# Patient Record
Sex: Male | Born: 1980 | Hispanic: Yes | Marital: Married | Smoking: Never smoker
Health system: Southern US, Community
[De-identification: ages and names within clinical notes are randomized; demographics above are authoritative.]

## PROBLEM LIST (undated history)

## (undated) DIAGNOSIS — I10 Essential (primary) hypertension: Secondary | ICD-10-CM

## (undated) HISTORY — PX: CHOLECYSTECTOMY: SHX55

## (undated) HISTORY — DX: Essential (primary) hypertension: I10

## (undated) HISTORY — PX: OTHER SURGICAL HISTORY: SHX169

---
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
## 2004-01-05 ENCOUNTER — Ambulatory Visit (HOSPITAL_COMMUNITY): Admission: RE | Admit: 2004-01-05 | Discharge: 2004-01-05 | Payer: Self-pay | Admitting: Orthopedic Surgery

## 2004-07-23 ENCOUNTER — Ambulatory Visit (HOSPITAL_BASED_OUTPATIENT_CLINIC_OR_DEPARTMENT_OTHER): Admission: RE | Admit: 2004-07-23 | Discharge: 2004-07-23 | Payer: Self-pay | Admitting: Orthopaedic Surgery

## 2004-08-07 ENCOUNTER — Ambulatory Visit (HOSPITAL_COMMUNITY): Admission: RE | Admit: 2004-08-07 | Discharge: 2004-08-07 | Payer: Self-pay | Admitting: Emergency Medicine

## 2004-08-07 ENCOUNTER — Emergency Department (HOSPITAL_COMMUNITY): Admission: EM | Admit: 2004-08-07 | Discharge: 2004-08-07 | Payer: Self-pay | Admitting: Emergency Medicine

## 2004-09-16 ENCOUNTER — Ambulatory Visit (HOSPITAL_COMMUNITY): Admission: RE | Admit: 2004-09-16 | Discharge: 2004-09-17 | Payer: Self-pay | Admitting: General Surgery

## 2005-04-19 IMAGING — US US ABDOMEN COMPLETE
1 series · 14 of 25 positions shown · non-contrast
Comparison: none

CLINICAL DATA: 23-year-old with right upper quadrant abdominal pain. 
 ULTRASOUND OF THE ABDOMEN COMPLETE:
 There is a contracted thick walled gallbladder with acoustic shadowing (REINELIO [REDACTED]).  Negative sonographic Murphy?s sign. The common bile duct is normal in caliber measuring 3.6 mm.  The liver demonstrates diffuse increased echogenicity with poor definition of the architecture and increased through transmission suggesting fatty infiltration. The IVC and aorta are normal.  The pancreas is not well visualized due to overlying bowel gas. The spleen is normal in size measuring 8.5 cm.  The right kidney measures 11.5 cm and the left kidney measures 12.1 cm.  No hydronephrosis, renal masses, or perinephric fluid collections.

[Series 1: unknown · 0.37mm/px · 14 of 84 slices shown]
[im 1/84]
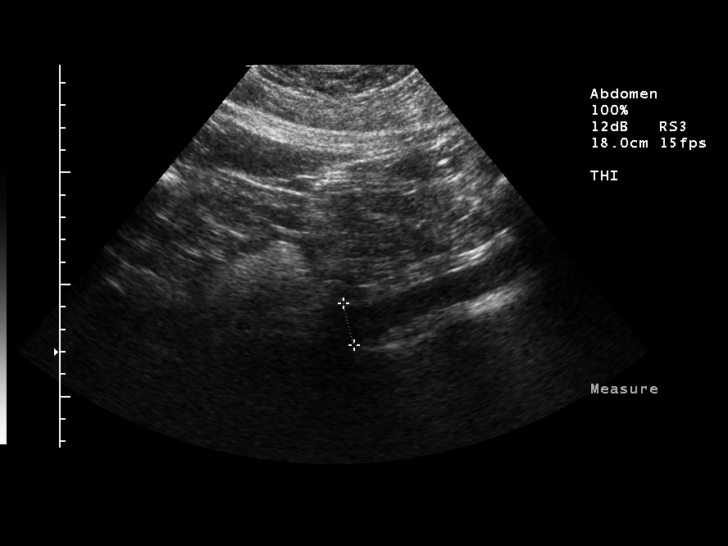
[im 7/84]
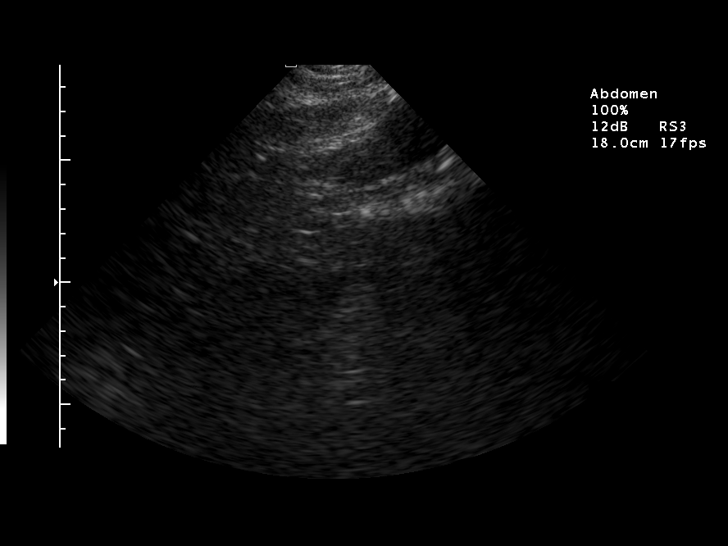
[im 14/84]
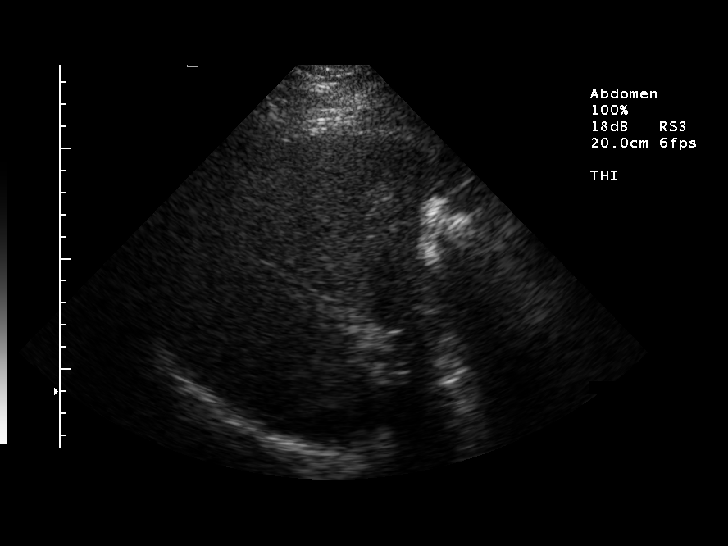
[im 21/84]
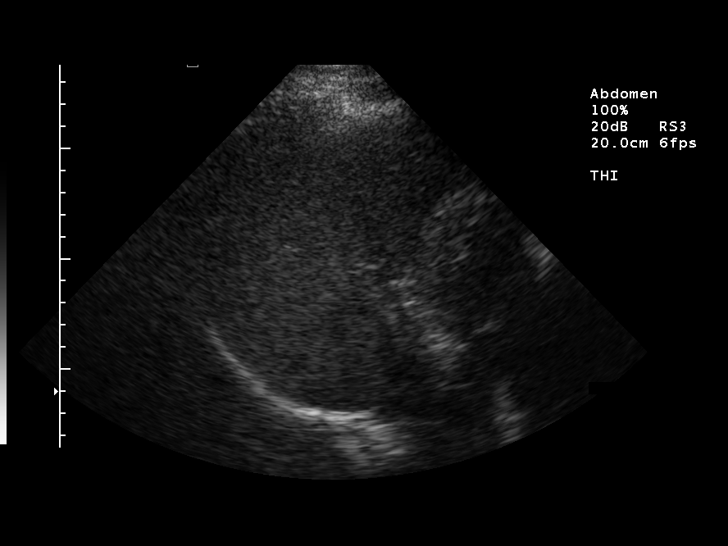
[im 28/84]
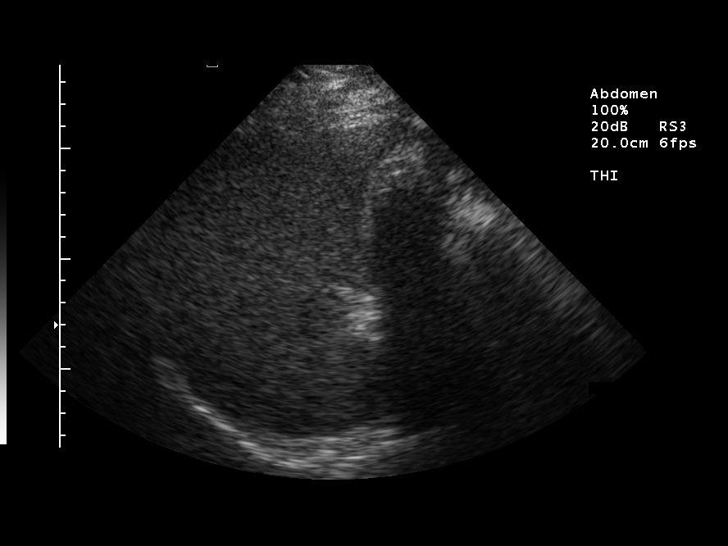
[im 32/84]
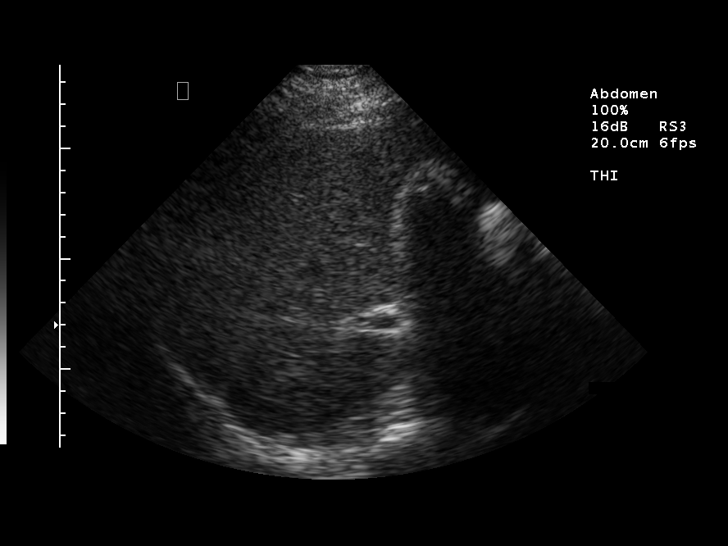
[im 39/84]
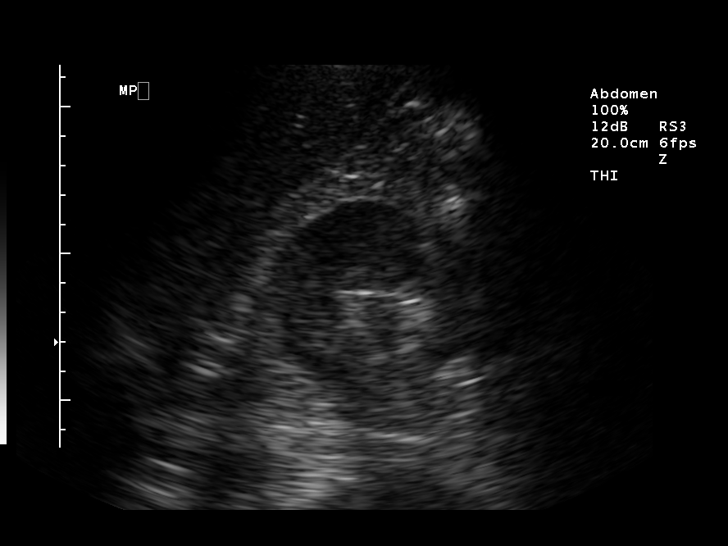
[im 45/84]
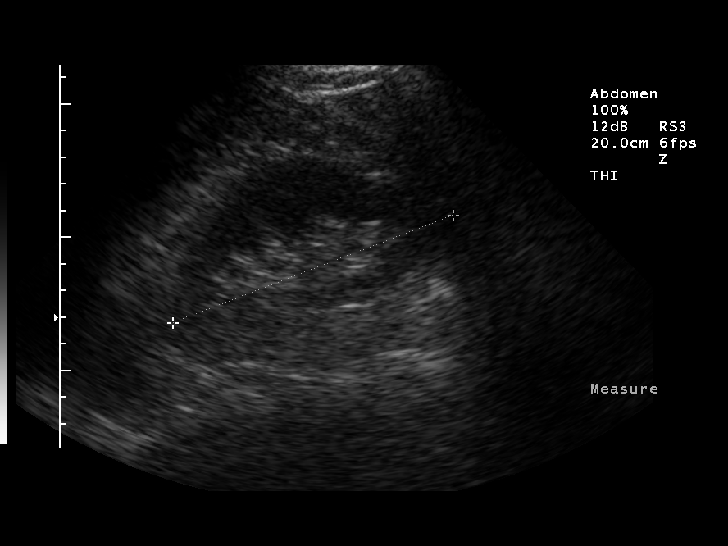
[im 52/84]
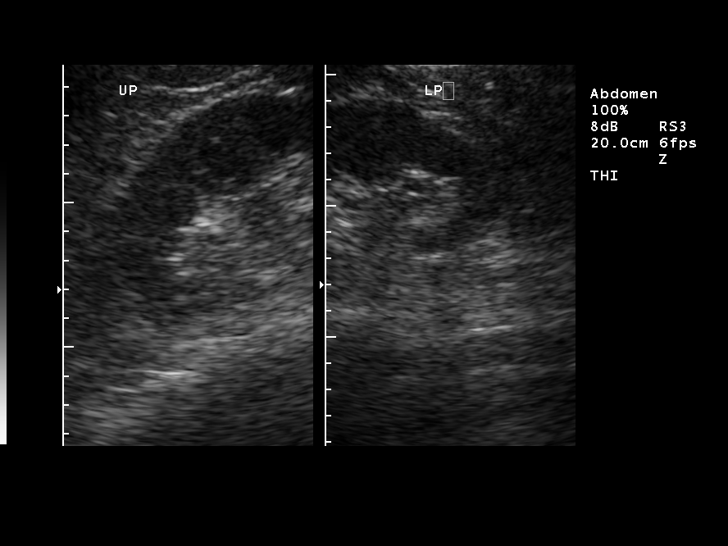
[im 56/84]
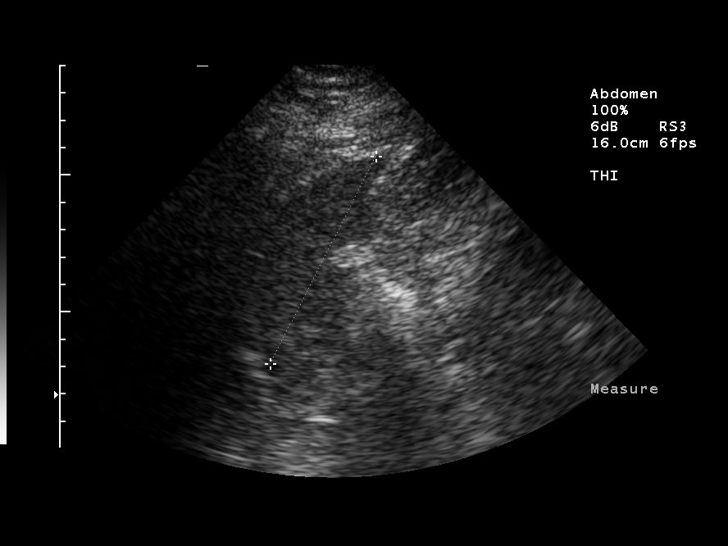
[im 63/84]
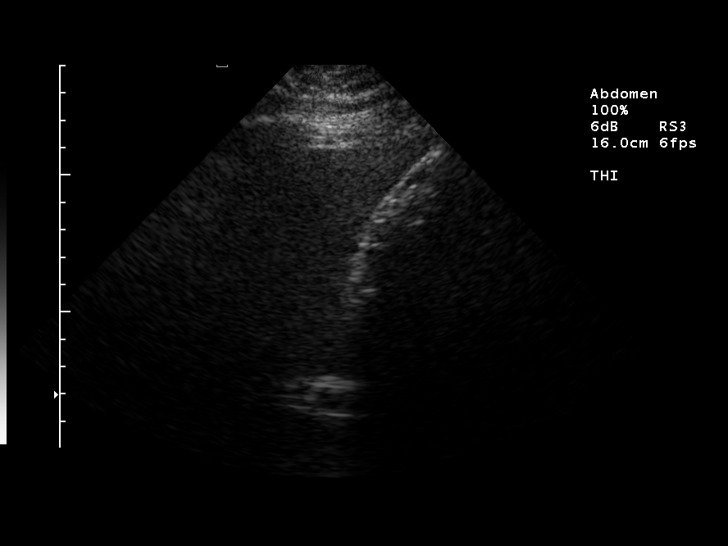
[im 70/84]
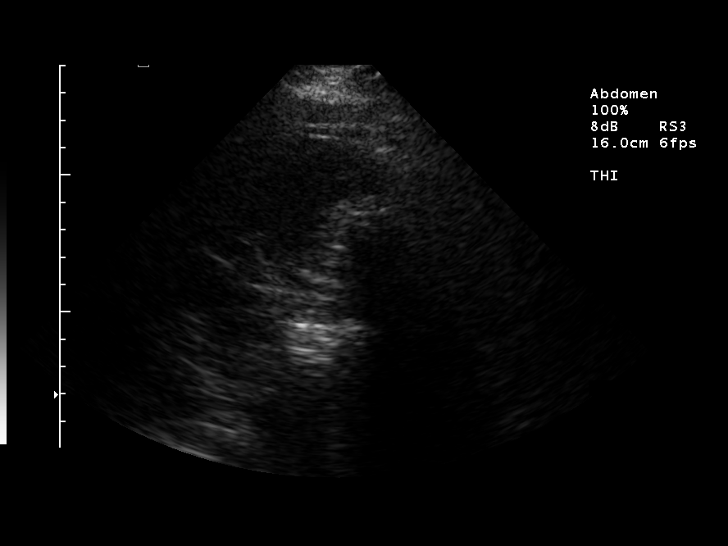
[im 77/84]
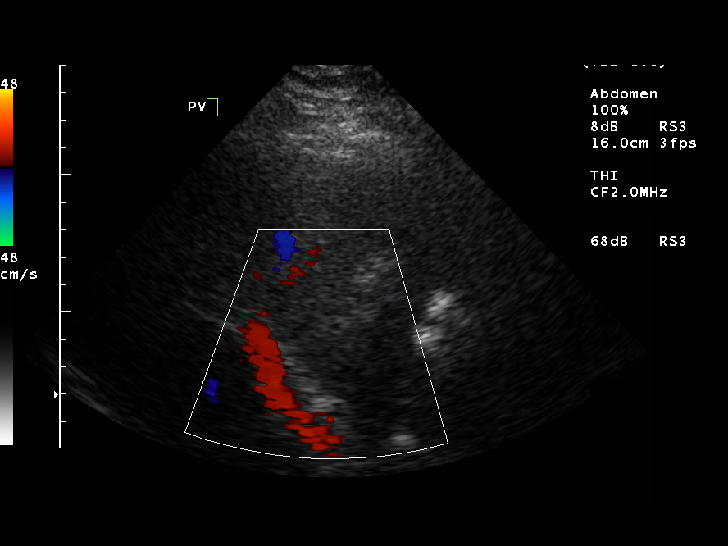
[im 84/84]
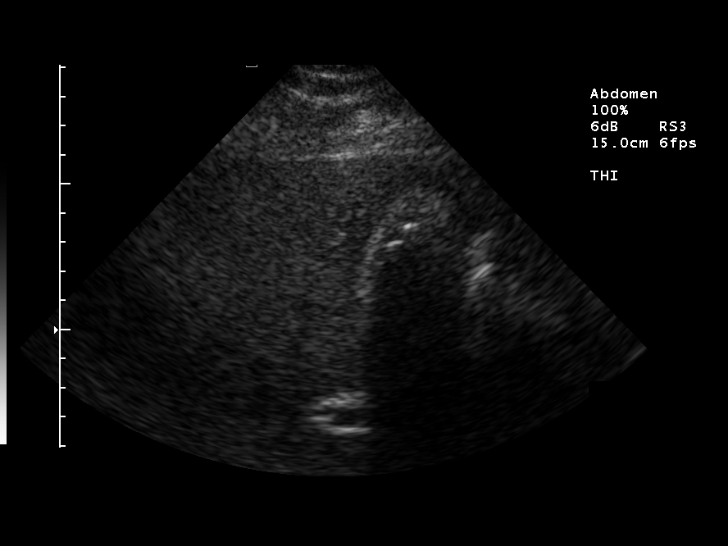

[14 of 25 positions shown; findings below may reference images not displayed]

IMPRESSION: 1.  Contracted stone-filled gallbladder with wall thickening (REINELIO [REDACTED]).  
 2.  Normal caliber common bile duct. 
 3.  Mild diffuse fatty infiltration of the liver.
 4.  Poor visualization of the pancreas.

## 2005-05-29 IMAGING — RF DG CHOLANGIOGRAM OPERATIVE
1 series · 4 of 4 positions shown · non-contrast
Comparison: none

CLINICAL DATA: Cholelithiasis.  Laparoscopic cholecystectomy.
 INTRAOPERATIVE CHOLANGIOGRAM ? 09/16/04: 
 Contrast was injected through the cystic duct remnant with filling of the common hepatic and the common bile ducts.  The cystic duct appears to have a low insertion.  Contrast is noted to pass readily from the common bile duct into the duodenum without obstruction or suggestion of stones.

[Series 1: run · 4 of 47 frames shown]
[frame 8/47]
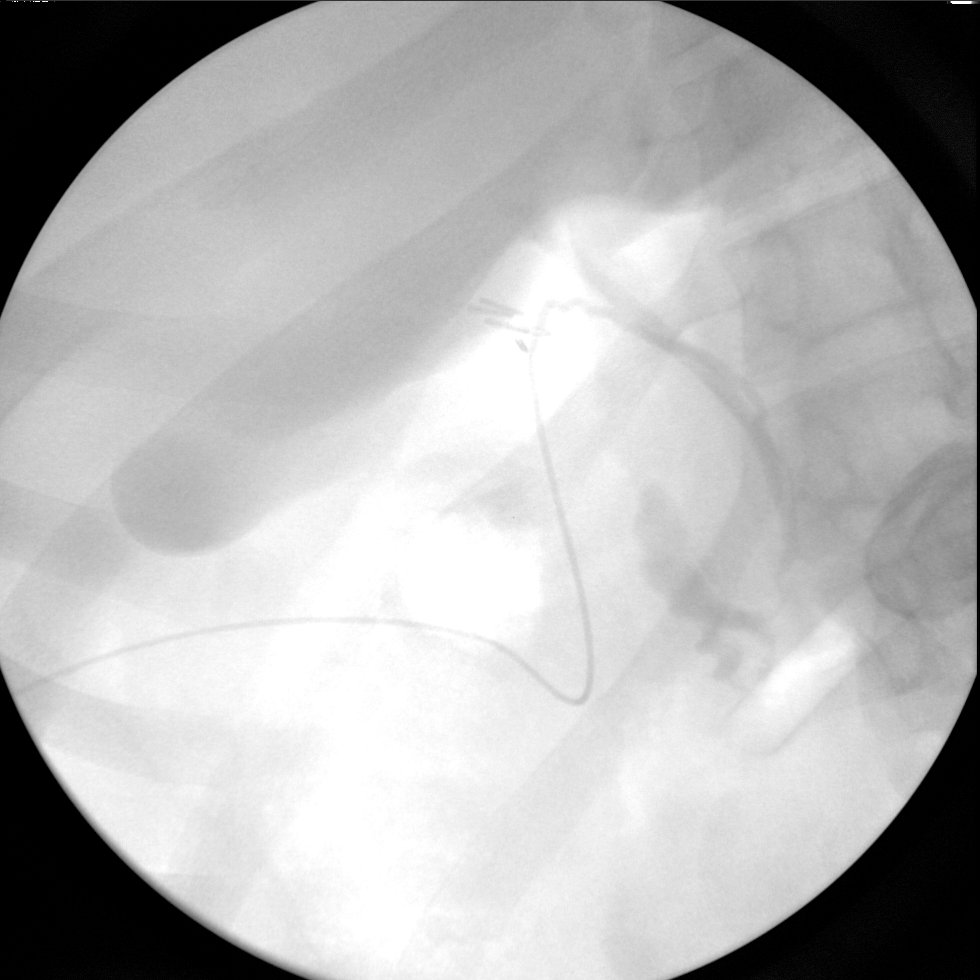
[frame 24/47]
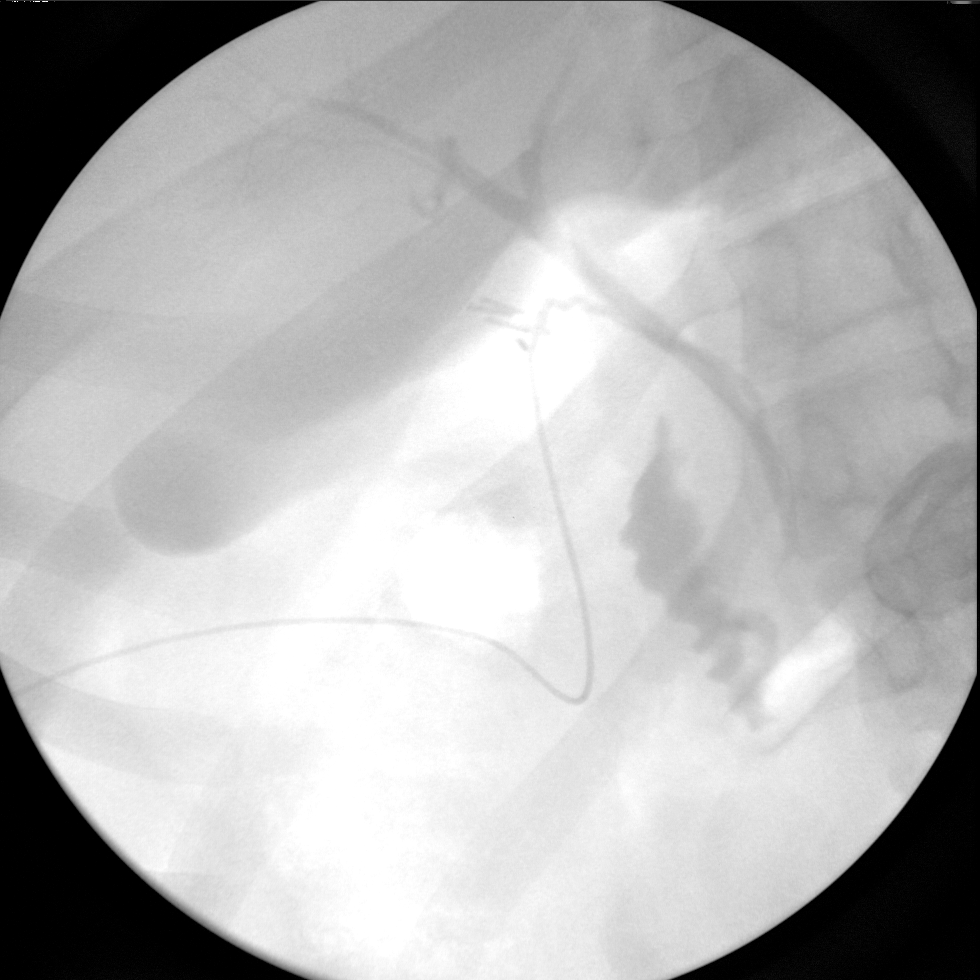
[frame 40/47]
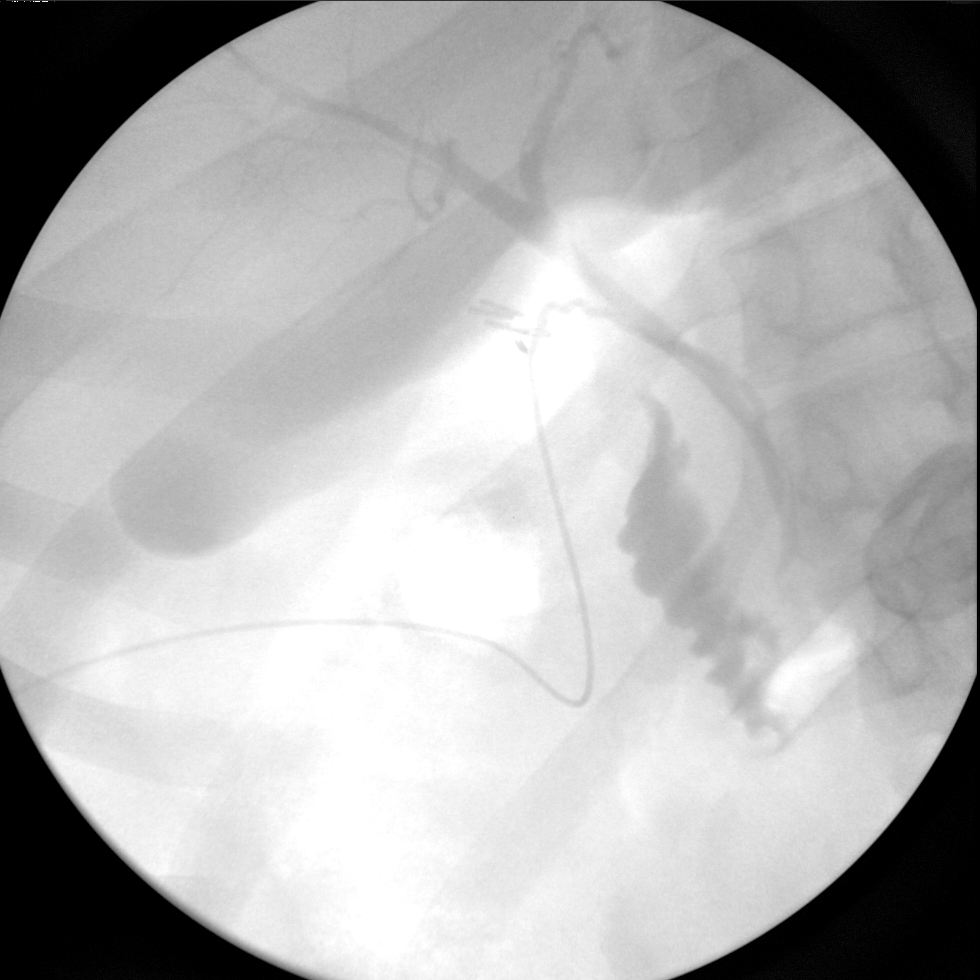
[frame 43/47]
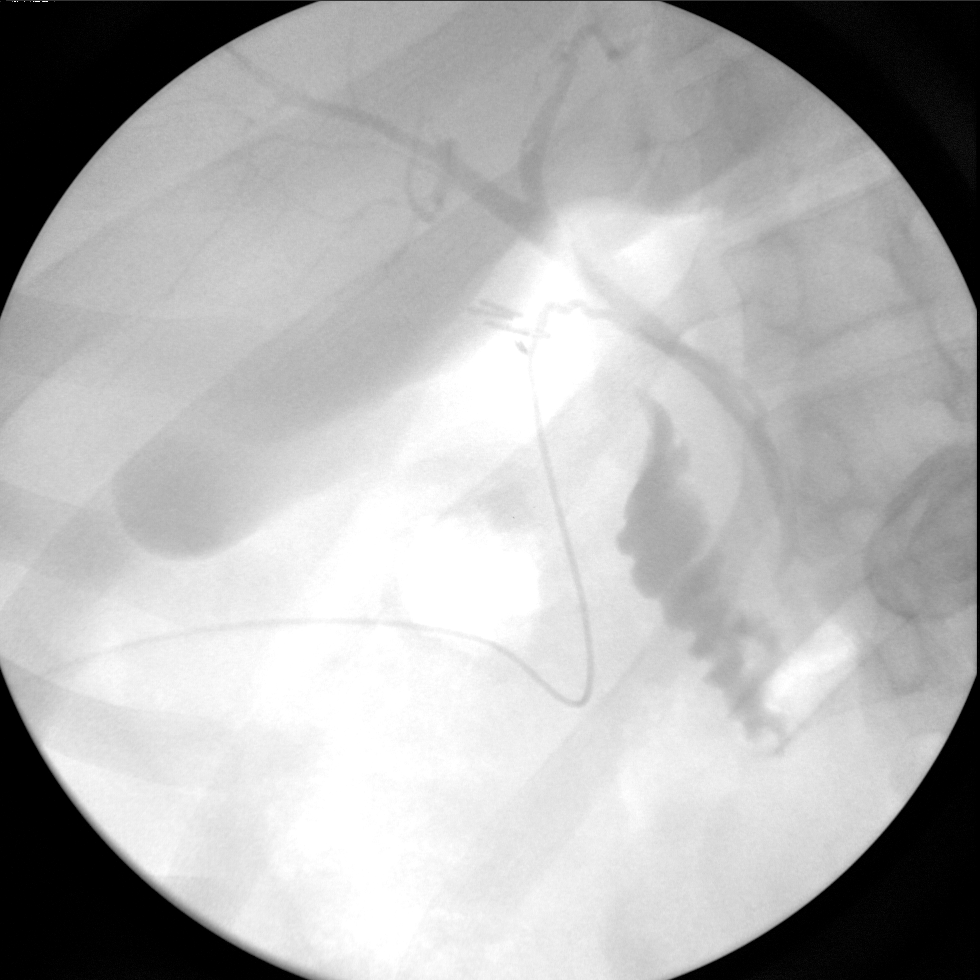

[4 of 4 positions shown; findings below may reference images not displayed]

IMPRESSION: Patency of the common bile duct without suggestion of stones with other discussion as above.

## 2021-05-20 ENCOUNTER — Other Ambulatory Visit: Payer: Self-pay

## 2021-05-21 ENCOUNTER — Encounter: Payer: Self-pay | Admitting: Medical

## 2021-05-21 ENCOUNTER — Ambulatory Visit (INDEPENDENT_AMBULATORY_CARE_PROVIDER_SITE_OTHER): Payer: PRIVATE HEALTH INSURANCE | Admitting: Medical

## 2021-05-21 VITALS — BP 140/85 | HR 66 | Temp 98.3°F | Resp 18 | Ht 71.0 in | Wt 299.2 lb

## 2021-05-21 DIAGNOSIS — Z8042 Family history of malignant neoplasm of prostate: Secondary | ICD-10-CM | POA: Diagnosis not present

## 2021-05-21 DIAGNOSIS — Z23 Encounter for immunization: Secondary | ICD-10-CM | POA: Diagnosis not present

## 2021-05-21 DIAGNOSIS — Z0000 Encounter for general adult medical examination without abnormal findings: Secondary | ICD-10-CM

## 2021-05-21 DIAGNOSIS — Z113 Encounter for screening for infections with a predominantly sexual mode of transmission: Secondary | ICD-10-CM | POA: Diagnosis not present

## 2021-05-21 DIAGNOSIS — R030 Elevated blood-pressure reading, without diagnosis of hypertension: Secondary | ICD-10-CM | POA: Diagnosis not present

## 2021-05-21 DIAGNOSIS — Z125 Encounter for screening for malignant neoplasm of prostate: Secondary | ICD-10-CM | POA: Diagnosis not present

## 2021-05-21 LAB — CBC WITH DIFFERENTIAL/PLATELET
Basophils Absolute: 0 10*3/uL (ref 0.0–0.1)
Basophils Relative: 0.6 % (ref 0.0–3.0)
Eosinophils Absolute: 0 10*3/uL (ref 0.0–0.7)
Eosinophils Relative: 0.6 % (ref 0.0–5.0)
HCT: 45.3 % (ref 39.0–52.0)
Hemoglobin: 15 g/dL (ref 13.0–17.0)
Lymphocytes Relative: 34.5 % (ref 12.0–46.0)
Lymphs Abs: 2.1 10*3/uL (ref 0.7–4.0)
MCHC: 33.1 g/dL (ref 30.0–36.0)
MCV: 82.2 fl (ref 78.0–100.0)
Monocytes Absolute: 0.5 10*3/uL (ref 0.1–1.0)
Monocytes Relative: 8.9 % (ref 3.0–12.0)
Neutro Abs: 3.3 10*3/uL (ref 1.4–7.7)
Neutrophils Relative %: 55.4 % (ref 43.0–77.0)
Platelets: 250 10*3/uL (ref 150.0–400.0)
RBC: 5.51 Mil/uL (ref 4.22–5.81)
RDW: 13.1 % (ref 11.5–15.5)
WBC: 6 10*3/uL (ref 4.0–10.5)

## 2021-05-21 LAB — COMPREHENSIVE METABOLIC PANEL
ALT: 10 U/L (ref 0–53)
AST: 18 U/L (ref 0–37)
Albumin: 4.4 g/dL (ref 3.5–5.2)
Alkaline Phosphatase: 67 U/L (ref 39–117)
BUN: 12 mg/dL (ref 6–23)
CO2: 30 mEq/L (ref 19–32)
Calcium: 9.5 mg/dL (ref 8.4–10.5)
Chloride: 102 mEq/L (ref 96–112)
Creatinine, Ser: 0.81 mg/dL (ref 0.40–1.50)
GFR: 110.4 mL/min (ref >60.00)
Glucose, Bld: 83 mg/dL (ref 70–99)
Potassium: 4.4 mEq/L (ref 3.5–5.1)
Sodium: 138 mEq/L (ref 135–145)
Total Bilirubin: 0.5 mg/dL (ref 0.2–1.2)
Total Protein: 7.2 g/dL (ref 6.0–8.3)

## 2021-05-21 LAB — LIPID PANEL
Cholesterol: 190 mg/dL (ref 0–200)
HDL: 49.9 mg/dL (ref >39.00)
LDL Cholesterol: 113 mg/dL — ABNORMAL HIGH (ref 0–99)
NonHDL: 140.25
Total CHOL/HDL Ratio: 4
Triglycerides: 136 mg/dL (ref 0.0–149.0)
VLDL: 27.2 mg/dL (ref 0.0–40.0)

## 2021-05-21 LAB — PSA: PSA: 0.68 ng/mL (ref 0.10–4.00)

## 2021-05-21 NOTE — Patient Instructions (Addendum)
For you wellness exam today I have ordered cbc, cmp,lipid panel and hiv.  Screening psa due to FH. Will consider referral to urologist based on family history.(After psa came back very low decided to monitor yearly basis or sooner if symptomatic)  Vaccine given today. Tdap only today. Declines flu and covid vaccine.  Recommend exercise and healthy diet.  We will let you know lab results as they come in.  Follow up date appointment will be determined after lab review.    For elevated blood pressure try low salt diet, mild exercise and 10-15 lb further weight loss. If bp remaining high then rx low dose bp med. Recommend get machine otc and check when relaxed at home. Would like bp to be 130/80 or less. If not let us know.  Preventive Care 80-40 Years Old, Male Preventive care refers to lifestyle choices and visits with your health care provider that can promote health and wellness. This includes: A yearly physical exam. This is also called an annual wellness visit. Regular dental and eye exams. Immunizations. Screening for certain conditions. Healthy lifestyle choices, such as: Eating a healthy diet. Getting regular exercise. Not using drugs or products that contain nicotine and tobacco. Limiting alcohol use. What can I expect for my preventive care visit? Physical exam Your health care provider will check your: Height and weight. These may be used to calculate your BMI (body mass index). BMI is a measurement that tells if you are at a healthy weight. Heart rate and blood pressure. Body temperature. Skin for abnormal spots. Counseling Your health care provider may ask you questions about your: Past medical problems. Family's medical history. Alcohol, tobacco, and drug use. Emotional well-being. Home life and relationship well-being. Sexual activity. Diet, exercise, and sleep habits. Work and work Astronomer. Access to firearms. What immunizations do I need? Vaccines are  usually given at various ages, according to a schedule. Your health care provider will recommend vaccines for you based on your age, medical history, and lifestyle or other factors, such as travel or where you work. What tests do I need? Blood tests Lipid and cholesterol levels. These may be checked every 5 years, or more often if you are over 10 years old. Hepatitis C test. Hepatitis B test. Screening Lung cancer screening. You may have this screening every year starting at age 40 if you have a 30-pack-year history of smoking and currently smoke or have quit within the past 15 years. Prostate cancer screening. Recommendations will vary depending on your family history and other risks. Genital exam to check for testicular cancer or hernias. Colorectal cancer screening. All adults should have this screening starting at age 40 and continuing until age 40. Your health care provider may recommend screening at age 40 if you are at increased risk. You will have tests every 1-10 years, depending on your results and the type of screening test. Diabetes screening. This is done by checking your blood sugar (glucose) after you have not eaten for a while (fasting). You may have this done every 1-3 years. STD (sexually transmitted disease) testing, if you are at risk. Follow these instructions at home: Eating and drinking  Eat a diet that includes fresh fruits and vegetables, whole grains, lean protein, and low-fat dairy products. Take vitamin and mineral supplements as recommended by your health care provider. Do not drink alcohol if your health care provider tells you not to drink. If you drink alcohol: Limit how much you have to 0-2 drinks a day. Be  aware of how much alcohol is in your drink. In the [place] one drink equals one 12 oz bottle of beer (355 mL), one 5 oz glass of wine (148 mL), or one 1 oz glass of hard liquor (44 mL). Lifestyle Take daily care of your teeth and gums. Brush your teeth  every morning and night with fluoride toothpaste. Floss one time each day. Stay active. Exercise for at least 30 minutes 5 or more days each week. Do not use any products that contain nicotine or tobacco, such as cigarettes, e-cigarettes, and chewing tobacco. If you need help quitting, ask your health care provider. Do not use drugs. If you are sexually active, practice safe sex. Use a condom or other form of protection to prevent STIs (sexually transmitted infections). If told by your health care provider, take low-dose aspirin daily starting at age 40. Find healthy ways to cope with stress, such as: Meditation, yoga, or listening to music. Journaling. Talking to a trusted person. Spending time with friends and family. Safety Always wear your seat belt while driving or riding in a vehicle. Do not drive: If you have been drinking alcohol. Do not ride with someone who has been drinking. When you are tired or distracted. While texting. Wear a helmet and other protective equipment during sports activities. If you have firearms in your house, make sure you follow all gun safety procedures. What's next? Go to your health care provider once a year for an annual wellness visit. Ask your health care provider how often you should have your eyes and teeth checked. Stay up to date on all vaccines. This information is not intended to replace advice given to you by your health care provider. Make sure you discuss any questions you have with your health care provider. Document Revised: 09/28/2020 Document Reviewed: 07/15/2018 Elsevier Patient Education  2022 [employer].

## 2021-05-21 NOTE — Progress Notes (Signed)
Subjective:    Patient ID: Eduardo Powell, male    DOB: 08-Jan-1981, 40 y.o.   MRN: [id number]  HPI Pt in for first time.  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 Pt not exercising. Pt trying to eat healthy. Some purposeful weight 25 lbs over past year. Non smoker. About 12 pack of alcohol a week. Drinks 2 cups of coffee a day.  Pt has no known medical problems.  He does want a physical exam. Family history of Dad and uncle had prostates. Pt thinks dad was in Mid 50's when he was dx. Uncles prostate CA in mid 50's as well. Urologist told sons should get checked.  No history of high blood pressure.   Pt had only one covid vaccine in past.    Review of Systems  Constitutional:  Negative for chills, fatigue and fever.  HENT:  Negative for dental problem.   Respiratory:  Negative for cough, chest tightness, shortness of breath and wheezing.   Cardiovascular:  Negative for chest pain and palpitations.  Gastrointestinal:  Negative for abdominal distention, abdominal pain, anal bleeding, blood in stool, diarrhea and nausea.  Musculoskeletal:  Negative for back pain.  Skin:  Negative for rash.  Neurological:  Negative for dizziness, seizures, speech difficulty, weakness and headaches.  Hematological:  Negative for adenopathy. Does not bruise/bleed easily.  Psychiatric/Behavioral:  Negative for behavioral problems and confusion.     No past medical history on file.   Social History   Socioeconomic History   Marital status: Married    Spouse name: Not on file   Number of children: Not on file   Years of education: Not on file   Highest education level: Not on file  Occupational History   Not on file  Tobacco Use   Smoking status: Not on file   Smokeless tobacco: Not on file  Substance and Sexual Activity   Alcohol use: Not on file   Drug use: Not on file   Sexual activity: Not on file  Other Topics Concern   Not on file  Social History Narrative   Not on  file   Social Determinants of Health   Financial Resource Strain: Not on file  Food Insecurity: Not on file  Transportation Needs: Not on file  Physical Activity: Not on file  Stress: Not on file  Social Connections: Not on file  Intimate Partner Violence: Not on file      Family History  Problem Relation Age of Onset   Cancer Father    Cancer Other     Not on File  No current outpatient medications on file prior to visit.   No current facility-administered medications on file prior to visit.    BP (!) 142/90   Pulse 66   Temp 98.3 F (36.8 C)   Resp 18   Ht 5\' 11"  (1.803 m)   Wt 299 lb 3.2 oz (135.7 kg)   SpO2 97%   BMI 41.73 kg/m        Objective:   Physical Exam  General Mental Status- Alert. General Appearance- Not in acute distress.   Skin General: Color- Normal Color. Moisture- Normal Moisture.  Neck Carotid Arteries- Normal color. Moisture- Normal Moisture. No carotid bruits. No JVD.  Chest and Lung Exam Auscultation: Breath Sounds:-Normal.  Cardiovascular Auscultation:Rythm- Regular. Murmurs & Other Heart Sounds:Auscultation of the heart reveals- No Murmurs.  Abdomen Inspection:-Inspeection Normal. Palpation/Percussion:Note:No mass. Palpation and Percussion of the abdomen reveal- Non Tender, Non  Distended + BS, no rebound or guarding.  Neurologic Cranial Nerve exam:- CN III-XII intact(No nystagmus), symmetric smile. Strength:- 5/5 equal and symmetric strength both upper and lower extremities.       Assessment & Plan:   Patient Instructions  For you wellness exam today I have ordered cbc, cmp,lipid panel and hiv.  Screening psa due to FH. Will consider referral to urologist based on family history.  Vaccine given today. Tdap only today. Declines flu and covid vaccine.  Recommend exercise and healthy diet.  We will let you know lab results as they come in.  Follow up date appointment will be determined after lab review.     For elevated blood pressure try low salt diet, mild exercise and 10-15 lb further weight loss. If bp remaining high then rx low dose bp med. Recommend get machine otc and check when relaxed at home. Would like bp to be 130/80 or less. If not let us know    Esperanza Richters, PA-C

## 2021-05-21 NOTE — Addendum Note (Signed)
Addended by: Maximino Sarin on: 05/21/2021 10:19 AM   Modules accepted: Orders

## 2021-05-22 LAB — HIV ANTIBODY (ROUTINE TESTING W REFLEX): HIV 1&2 Ab, 4th Generation: NONREACTIVE

## 2021-09-24 ENCOUNTER — Ambulatory Visit (INDEPENDENT_AMBULATORY_CARE_PROVIDER_SITE_OTHER): Payer: PRIVATE HEALTH INSURANCE | Admitting: Medical

## 2021-09-24 ENCOUNTER — Encounter: Payer: Self-pay | Admitting: Medical

## 2021-09-24 VITALS — BP 134/90 | HR 71 | Resp 18 | Ht 71.0 in | Wt 289.0 lb

## 2021-09-24 DIAGNOSIS — I10 Essential (primary) hypertension: Secondary | ICD-10-CM

## 2021-09-24 MED ORDER — LOSARTAN POTASSIUM 25 MG PO TABS: 25.0000 mg | ORAL_TABLET | Freq: Every day | ORAL | 11 refills | Status: DC

## 2021-09-24 NOTE — Progress Notes (Signed)
Subjective:    Patient ID: Eduardo Powell, male    DOB: [birth year], 41 y.o.   MRN: [id number]  HPI  Pt had some bp levels in moderate hight range. 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 Also at home readings bp was 140/90.   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  No hx of htn/being on meds.  Pt has been trying to reduce salt.    Has been beginning to hike on Saturdays.  05-05-21 normal cmp.   Review of Systems  Constitutional:  Negative for chills, fatigue and fever.  HENT:  Negative for congestion and drooling.   Respiratory:  Negative for cough, chest tightness, shortness of breath and wheezing.   Cardiovascular:  Negative for chest pain and palpitations.  Gastrointestinal:  Negative for abdominal pain.  Genitourinary:  Negative for dysuria and frequency.  Musculoskeletal:  Negative for back pain, joint swelling, myalgias and neck pain.  Neurological:  Negative for dizziness, numbness and headaches.  Hematological:  Negative for adenopathy. Does not bruise/bleed easily.  Psychiatric/Behavioral:  Negative for behavioral problems and dysphoric mood.     No past medical history on file.   Social History   Socioeconomic History   Marital status: Married    Spouse name: Not on file   Number of children: Not on file   Years of education: Not on file   Highest education level: Not on file  Occupational History   Not on file  Tobacco Use   Smoking status: Never   Smokeless tobacco: Never  Vaping Use   Vaping Use: Never used  Substance and Sexual Activity   Alcohol use: Yes    Alcohol/week: 12.0 standard drinks    Types: 12 Cans of beer per week    Comment: ususally spread out over a week.   Drug use: Never   Sexual activity: Yes  Other Topics Concern   Not on file  Social History Narrative   ** Merged History Encounter **       Social Determinants of Health   Financial Resource Strain: Not on file  Food Insecurity: Not on file  Transportation Needs: Not on file   Physical Activity: Not on file  Stress: Not on file  Social Connections: Not on file  Intimate Partner Violence: Not on file    Past Surgical History:  Procedure Laterality Date   CHOLECYSTECTOMY     rt side acl surgery      Family History  Problem Relation Age of Onset   Cancer Father    Cancer Other     Not on File  Current Outpatient Medications on File Prior to Visit  Medication Sig Dispense Refill   magnesium chloride (SLOW-MAG) 64 MG TBEC SR tablet Take by mouth.     No current facility-administered medications on file prior to visit.    BP (!) 127/93    Pulse 71    Resp 18    Ht 5\' 11"  (1.803 m)    Wt 289 lb (131.1 kg)    SpO2 97%    BMI 40.31 kg/m       Objective:   Physical Exam  General Mental Status- Alert. General Appearance- Not in acute distress.   Skin General: Color- Normal Color. Moisture- Normal Moisture.  Neck Carotid Arteries- Normal color. Moisture- Normal Moisture. No carotid bruits. No JVD.  Chest and Lung Exam Auscultation: Breath Sounds:-Normal.  Cardiovascular Auscultation:Rythm- Regular. Murmurs & Other Heart Sounds:Auscultation of the heart reveals- No Murmurs.  Abdomen Inspection:-Inspeection Normal.  Palpation/Percussion:Note:No mass. Palpation and Percussion of the abdomen reveal- Non Tender, Non Distended + BS, no rebound or guarding.   Neurologic Cranial Nerve exam:- CN III-XII intact(No nystagmus), symmetric smile. Strength:- 5/5 equal and symmetric strength both upper and lower extremities.       Assessment & Plan:   Patient Instructions  Htn- your blood pressure is mild high today but on average bp is 140/90. Recommend losartan 25 mg daily. Low salt diet, exercise and wt loss.  Recommend try [employer] app could be way to lose weight.  Follow up in one month or sooner if needed.   Mackie Pai, PA-C

## 2021-09-24 NOTE — Patient Instructions (Addendum)
Htn- your blood pressure is mild high today but on average bp is 140/90. Recommend losartan 25 mg daily. Low salt diet, exercise and wt loss.  Recommend try [employer] app could be way to lose weight.  Follow up in one month or sooner if needed.

## 2021-10-25 ENCOUNTER — Telehealth: Payer: PRIVATE HEALTH INSURANCE | Admitting: Medical

## 2021-10-29 ENCOUNTER — Encounter: Payer: Self-pay | Admitting: Medical

## 2021-10-29 ENCOUNTER — Telehealth (INDEPENDENT_AMBULATORY_CARE_PROVIDER_SITE_OTHER): Payer: PRIVATE HEALTH INSURANCE | Admitting: Medical

## 2021-10-29 VITALS — BP 110/68 | Wt 275.0 lb

## 2021-10-29 DIAGNOSIS — I10 Essential (primary) hypertension: Secondary | ICD-10-CM | POA: Diagnosis not present

## 2021-10-29 NOTE — Patient Instructions (Signed)
Hypertension much improved since last visit.  Appears low-dose losartan, low-salt diet and weight loss has brought her blood pressure down to tightly controlled range.  Continue with current treatment but do recommend that you check your blood pressure at least 3 times a week.  If you notice a blood pressure dropping less than 110 systolic/top number or if you are having lightheaded episodes going from sitting to standing let me know.  In that event would switch you to a lower dose/less potent medication or consider stopping BP medication altogether.  Currently have refills of losartan for 1 month. ? ?If all goes well I recommend follow-up in October for wellness exam and blood pressure check.  But sooner if needed. ?

## 2021-10-29 NOTE — Progress Notes (Signed)
? ?  Subjective:  ? ? Patient ID: Eduardo Powell, male    DOB: Nov 05, 1980, 41 y.o.   MRN: [id number] ? ?HPI ? ?Virtual Visit via Video Note ? ?I connected with Odette Horns on 10/29/21 at  4:00 PM EDT by a video enabled telemedicine application and verified that I am speaking with the correct person using two identifiers. ? ?Location: ?Patient: in [place]. ?Provider: office ?  ?I discussed the limitations of evaluation and management by telemedicine and the availability of in person appointments. The patient expressed understanding and agreed to proceed. ? ?History of Present Illness: ? ?Pt states he has been checking his bp levels have been good. He bp was 110/68 recently. Other readings 110-119/60-78. Pt states he has been eating a lot less salt. He identified that a lot of foods he was eating was very high in sodium. He has been grilling chicken and eating salads. Using dash seasonings.  ? ?One day he ate [place] food and bp did jump to 150 systolic. ? ?Pt did start the losartan one month ago. ? ? 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 ? ?Pt has no light sensitivity on standing.  ? ?Pt has lost weight. ? ? ?Observations/Objective: ?General-no acute distress, pleasant, oriented. ?Lungs- on inspection lungs appear unlabored. ?Neck- no tracheal deviation or jvd on inspection. ?Neuro- gross motor function appears intact.  ? ?Assessment and Plan: ? ?Patient Instructions  ?Hypertension much improved since last visit.  Appears low-dose losartan, low-salt diet and weight loss has brought her blood pressure down to tightly controlled range.  Continue with current treatment but do recommend that you check your blood pressure at least 3 times a week.  If you notice a blood pressure dropping less than 110 systolic/top number or if you are having lightheaded episodes going from sitting to standing let me know.  In that event would switch you to a lower dose/less potent medication or consider stopping BP medication altogether.   Currently have refills of losartan for 1 month. ? ?If all goes well I recommend follow-up in October for wellness exam and blood pressure check.  But sooner if needed.  ?Esperanza Richters, PA-C  ?Follow Up Instructions: ? ?  ?I discussed the assessment and treatment plan with the patient. The patient was provided an opportunity to ask questions and all were answered. The patient agreed with the plan and demonstrated an understanding of the instructions. ?  ?The patient was advised to call back or seek an in-person evaluation if the symptoms worsen or if the condition fails to improve as anticipated. ? ?Time spent with patient today was 15  minutes which consisted of chart revdiew, discussing diagnosis, work up treatment and documentation.  ? ?Esperanza Richters, PA-C  ? ?Review of Systems ? ?   ?Objective:  ? Physical Exam ? ? ? ? ?   ?Assessment & Plan:  ? ? ?

## 2022-08-05 ENCOUNTER — Encounter: Payer: Self-pay | Admitting: Medical

## 2022-08-05 ENCOUNTER — Ambulatory Visit (INDEPENDENT_AMBULATORY_CARE_PROVIDER_SITE_OTHER): Payer: PRIVATE HEALTH INSURANCE | Admitting: Medical

## 2022-08-05 VITALS — BP 120/80 | HR 69 | Ht 71.0 in | Wt 297.6 lb

## 2022-08-05 DIAGNOSIS — Z0000 Encounter for general adult medical examination without abnormal findings: Secondary | ICD-10-CM | POA: Diagnosis not present

## 2022-08-05 DIAGNOSIS — Z125 Encounter for screening for malignant neoplasm of prostate: Secondary | ICD-10-CM | POA: Diagnosis not present

## 2022-08-05 LAB — CBC WITH DIFFERENTIAL/PLATELET
Basophils Absolute: 0 10*3/uL (ref 0.0–0.1)
Basophils Relative: 0.4 % (ref 0.0–3.0)
Eosinophils Absolute: 0 10*3/uL (ref 0.0–0.7)
Eosinophils Relative: 0.7 % (ref 0.0–5.0)
HCT: 42.6 % (ref 39.0–52.0)
Hemoglobin: 14.4 g/dL (ref 13.0–17.0)
Lymphocytes Relative: 45.2 % (ref 12.0–46.0)
Lymphs Abs: 2.1 10*3/uL (ref 0.7–4.0)
MCHC: 33.9 g/dL (ref 30.0–36.0)
MCV: 81.6 fl (ref 78.0–100.0)
Monocytes Absolute: 0.5 10*3/uL (ref 0.1–1.0)
Monocytes Relative: 10.1 % (ref 3.0–12.0)
Neutro Abs: 2 10*3/uL (ref 1.4–7.7)
Neutrophils Relative %: 43.6 % (ref 43.0–77.0)
Platelets: 252 10*3/uL (ref 150.0–400.0)
RBC: 5.22 Mil/uL (ref 4.22–5.81)
RDW: 12.9 % (ref 11.5–15.5)
WBC: 4.6 10*3/uL (ref 4.0–10.5)

## 2022-08-05 LAB — LIPID PANEL
Cholesterol: 183 mg/dL (ref 0–200)
HDL: 37.3 mg/dL — ABNORMAL LOW (ref >39.00)
LDL Cholesterol: 108 mg/dL — ABNORMAL HIGH (ref 0–99)
NonHDL: 145.66
Total CHOL/HDL Ratio: 5
Triglycerides: 189 mg/dL — ABNORMAL HIGH (ref 0.0–149.0)
VLDL: 37.8 mg/dL (ref 0.0–40.0)

## 2022-08-05 LAB — COMPREHENSIVE METABOLIC PANEL
ALT: 37 U/L (ref 0–53)
AST: 42 U/L — ABNORMAL HIGH (ref 0–37)
Albumin: 4.1 g/dL (ref 3.5–5.2)
Alkaline Phosphatase: 55 U/L (ref 39–117)
BUN: 11 mg/dL (ref 6–23)
CO2: 27 mEq/L (ref 19–32)
Calcium: 9.1 mg/dL (ref 8.4–10.5)
Chloride: 103 mEq/L (ref 96–112)
Creatinine, Ser: 0.72 mg/dL (ref 0.40–1.50)
GFR: 113.43 mL/min (ref >60.00)
Glucose, Bld: 91 mg/dL (ref 70–99)
Potassium: 4.1 mEq/L (ref 3.5–5.1)
Sodium: 138 mEq/L (ref 135–145)
Total Bilirubin: 0.5 mg/dL (ref 0.2–1.2)
Total Protein: 7 g/dL (ref 6.0–8.3)

## 2022-08-05 LAB — PSA: PSA: 0.98 ng/mL (ref 0.10–4.00)

## 2022-08-05 MED ORDER — LOSARTAN POTASSIUM 25 MG PO TABS: 25.0000 mg | ORAL_TABLET | Freq: Every day | ORAL | 3 refills | Status: DC

## 2022-08-05 NOTE — Progress Notes (Signed)
Subjective:    Patient ID: Eduardo Powell, male    DOB: 1981/04/18, 42 y.o.   MRN: [id number]  HPI  Pt in for wellness exam.  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 Pt not exercising. Pt trying to eat healthy. Some purposeful weight 25 lbs over past 2 year(he has maintained wt loss). Non smoker. About 4-6 beers a week. Drinks 2 cups of coffee a day.    Pt declines flu vaccine. Recent uri/viral syndrome but now resolved.   Htn- on losartan 25 mg daily.      Review of Systems  Constitutional:  Negative for chills, fatigue and fever.  HENT:  Negative for congestion, ear discharge, facial swelling and mouth sores.   Respiratory:  Negative for cough, chest tightness, shortness of breath and wheezing.   Cardiovascular:  Negative for palpitations.  Gastrointestinal:  Negative for abdominal pain, constipation and nausea.  Genitourinary:  Negative for dysuria, frequency and hematuria.  Musculoskeletal:  Negative for back pain, myalgias and neck stiffness.  Skin:  Negative for rash.  Neurological:  Negative for dizziness, seizures, syncope, weakness and light-headedness.  Hematological:  Negative for adenopathy. Does not bruise/bleed easily.  Psychiatric/Behavioral:  Negative for behavioral problems, decreased concentration and hallucinations. The patient is not nervous/anxious.     No past medical history on file.   Social History   Socioeconomic History   Marital status: Married    Spouse name: Not on file   Number of children: Not on file   Years of education: Not on file   Highest education level: Not on file  Occupational History   Not on file  Tobacco Use   Smoking status: Never   Smokeless tobacco: Never  Vaping Use   Vaping Use: Never used  Substance and Sexual Activity   Alcohol use: Yes    Alcohol/week: 12.0 standard drinks of alcohol    Types: 12 Cans of beer per week    Comment: ususally spread out over a week.   Drug use: Never   Sexual  activity: Yes  Other Topics Concern   Not on file  Social History Narrative   ** Merged History Encounter **       Social Determinants of Health   Financial Resource Strain: Not on file  Food Insecurity: Not on file  Transportation Needs: Not on file  Physical Activity: Not on file  Stress: Not on file  Social Connections: Not on file  Intimate Partner Violence: Not on file    Past Surgical History:  Procedure Laterality Date   CHOLECYSTECTOMY     rt side acl surgery      Family History  Problem Relation Age of Onset   Cancer Father    Cancer Other     Not on File  Current Outpatient Medications on File Prior to Visit  Medication Sig Dispense Refill   losartan (COZAAR) 25 MG tablet Take 1 tablet (25 mg total) by mouth daily. 30 tablet 11   No current facility-administered medications on file prior to visit.    BP 120/80   Pulse 69   Ht 5\' 11"  (1.803 m)   Wt 297 lb 9.6 oz (135 kg)   SpO2 97%   BMI 41.51 kg/m        Objective:   Physical Exam  General Mental Status- Alert. General Appearance- Not in acute distress.   Skin General: Color- Normal Color. Moisture- Normal Moisture.  Neck Carotid Arteries- Normal color. Moisture- Normal Moisture.  No carotid bruits. No JVD.  Chest and Lung Exam Auscultation: Breath Sounds:-Normal.  Cardiovascular Auscultation:Rythm- Regular. Murmurs & Other Heart Sounds:Auscultation of the heart reveals- No Murmurs.  Abdomen Inspection:-Inspeection Normal. Palpation/Percussion:Note:No mass. Palpation and Percussion of the abdomen reveal- Non Tender, Non Distended + BS, no rebound or guarding.   Neurologic Cranial Nerve exam:- CN III-XII intact(No nystagmus), symmetric smile. Strength:- 5/5 equal and symmetric strength both upper and lower extremities.       Assessment & Plan:    Patient Instructions  For you wellness exam today I have ordered cbc, cmp and  lipid panel.   Flu vaccine declined.  Recommend  exercise and healthy diet.  We will let you know lab results as they come in.  Follow up date appointment will be determined after lab review.         Mackie Pai, PA-C

## 2022-08-05 NOTE — Addendum Note (Signed)
Addended by: Anabel Halon on: 08/05/2022 09:15 AM   Modules accepted: Orders

## 2022-08-05 NOTE — Patient Instructions (Addendum)
For you wellness exam today I have ordered cbc, cmp and lipid panel.   Flu vaccine declined.  Recommend exercise and healthy diet.  We will let you know lab results as they come in.  Follow up date appointment will be determined after lab review.     Preventive Care 46-42 Years Old, Male Preventive care refers to lifestyle choices and visits with your health care provider that can promote health and wellness. Preventive care visits are also called wellness exams. What can I expect for my preventive care visit? Counseling During your preventive care visit, your health care provider may ask about your: Medical history, including: Past medical problems. Family medical history. Current health, including: Emotional well-being. Home life and relationship well-being. Sexual activity. Lifestyle, including: Alcohol, nicotine or tobacco, and drug use. Access to firearms. Diet, exercise, and sleep habits. Safety issues such as seatbelt and bike helmet use. Sunscreen use. Work and work Statistician. Physical exam Your health care provider will check your: Height and weight. These may be used to calculate your BMI (body mass index). BMI is a measurement that tells if you are at a healthy weight. Waist circumference. This measures the distance around your waistline. This measurement also tells if you are at a healthy weight and may help predict your risk of certain diseases, such as type 2 diabetes and high blood pressure. Heart rate and blood pressure. Body temperature. Skin for abnormal spots. What immunizations do I need?  Vaccines are usually given at various ages, according to a schedule. Your health care provider will recommend vaccines for you based on your age, medical history, and lifestyle or other factors, such as travel or where you work. What tests do I need? Screening Your health care provider may recommend screening tests for certain conditions. This may include: Lipid and  cholesterol levels. Diabetes screening. This is done by checking your blood sugar (glucose) after you have not eaten for a while (fasting). Hepatitis B test. Hepatitis C test. HIV (human immunodeficiency virus) test. STI (sexually transmitted infection) testing, if you are at risk. Lung cancer screening. Prostate cancer screening. Colorectal cancer screening. Talk with your health care provider about your test results, treatment options, and if necessary, the need for more tests. Follow these instructions at home: Eating and drinking  Eat a diet that includes fresh fruits and vegetables, whole grains, lean protein, and low-fat dairy products. Take vitamin and mineral supplements as recommended by your health care provider. Do not drink alcohol if your health care provider tells you not to drink. If you drink alcohol: Limit how much you have to 0-2 drinks a day. Know how much alcohol is in your drink. In the [place] one drink equals one 12 oz bottle of beer (355 mL), one 5 oz glass of wine (148 mL), or one 1 oz glass of hard liquor (44 mL). Lifestyle Brush your teeth every morning and night with fluoride toothpaste. Floss one time each day. Exercise for at least 30 minutes 5 or more days each week. Do not use any products that contain nicotine or tobacco. These products include cigarettes, chewing tobacco, and vaping devices, such as e-cigarettes. If you need help quitting, ask your health care provider. Do not use drugs. If you are sexually active, practice safe sex. Use a condom or other form of protection to prevent STIs. Take aspirin only as told by your health care provider. Make sure that you understand how much to take and what form to take. Work with your  health care provider to find out whether it is safe and beneficial for you to take aspirin daily. Find healthy ways to manage stress, such as: Meditation, yoga, or listening to music. Journaling. Talking to a trusted  person. Spending time with friends and family. Minimize exposure to UV radiation to reduce your risk of skin cancer. Safety Always wear your seat belt while driving or riding in a vehicle. Do not drive: If you have been drinking alcohol. Do not ride with someone who has been drinking. When you are tired or distracted. While texting. If you have been using any mind-altering substances or drugs. Wear a helmet and other protective equipment during sports activities. If you have firearms in your house, make sure you follow all gun safety procedures. What's next? Go to your health care provider once a year for an annual wellness visit. Ask your health care provider how often you should have your eyes and teeth checked. Stay up to date on all vaccines. This information is not intended to replace advice given to you by your health care provider. Make sure you discuss any questions you have with your health care provider. Document Revised: 01/16/2021 Document Reviewed: 01/16/2021 Elsevier Patient Education  [employer].

## 2023-08-14 ENCOUNTER — Ambulatory Visit (INDEPENDENT_AMBULATORY_CARE_PROVIDER_SITE_OTHER): Payer: PRIVATE HEALTH INSURANCE | Admitting: Medical

## 2023-08-14 ENCOUNTER — Encounter: Payer: Self-pay | Admitting: Medical

## 2023-08-14 VITALS — BP 120/80 | HR 70 | Resp 18 | Ht 71.0 in | Wt 306.0 lb

## 2023-08-14 DIAGNOSIS — I10 Essential (primary) hypertension: Secondary | ICD-10-CM

## 2023-08-14 DIAGNOSIS — Z0000 Encounter for general adult medical examination without abnormal findings: Secondary | ICD-10-CM

## 2023-08-14 LAB — CBC WITH DIFFERENTIAL/PLATELET
Basophils Absolute: 0 10*3/uL (ref 0.0–0.1)
Basophils Relative: 0.7 % (ref 0.0–3.0)
Eosinophils Absolute: 0 10*3/uL (ref 0.0–0.7)
Eosinophils Relative: 0.7 % (ref 0.0–5.0)
HCT: 48.8 % (ref 39.0–52.0)
Hemoglobin: 16 g/dL (ref 13.0–17.0)
Lymphocytes Relative: 34.3 % (ref 12.0–46.0)
Lymphs Abs: 2.3 10*3/uL (ref 0.7–4.0)
MCHC: 32.7 g/dL (ref 30.0–36.0)
MCV: 84.2 fL (ref 78.0–100.0)
Monocytes Absolute: 0.7 10*3/uL (ref 0.1–1.0)
Monocytes Relative: 10.8 % (ref 3.0–12.0)
Neutro Abs: 3.6 10*3/uL (ref 1.4–7.7)
Neutrophils Relative %: 53.5 % (ref 43.0–77.0)
Platelets: 287 10*3/uL (ref 150.0–400.0)
RBC: 5.8 Mil/uL (ref 4.22–5.81)
RDW: 13.6 % (ref 11.5–15.5)
WBC: 6.7 10*3/uL (ref 4.0–10.5)

## 2023-08-14 LAB — COMPREHENSIVE METABOLIC PANEL
ALT: 14 U/L (ref 0–53)
AST: 20 U/L (ref 0–37)
Albumin: 4.6 g/dL (ref 3.5–5.2)
Alkaline Phosphatase: 65 U/L (ref 39–117)
BUN: 15 mg/dL (ref 6–23)
CO2: 29 meq/L (ref 19–32)
Calcium: 9.6 mg/dL (ref 8.4–10.5)
Chloride: 100 meq/L (ref 96–112)
Creatinine, Ser: 0.81 mg/dL (ref 0.40–1.50)
GFR: 108.68 mL/min (ref >60.00)
Glucose, Bld: 80 mg/dL (ref 70–99)
Potassium: 4.5 meq/L (ref 3.5–5.1)
Sodium: 137 meq/L (ref 135–145)
Total Bilirubin: 0.6 mg/dL (ref 0.2–1.2)
Total Protein: 7.6 g/dL (ref 6.0–8.3)

## 2023-08-14 LAB — LIPID PANEL
Cholesterol: 192 mg/dL (ref 0–200)
HDL: 44.6 mg/dL (ref >39.00)
LDL Cholesterol: 125 mg/dL — ABNORMAL HIGH (ref 0–99)
NonHDL: 147.31
Total CHOL/HDL Ratio: 4
Triglycerides: 113 mg/dL (ref 0.0–149.0)
VLDL: 22.6 mg/dL (ref 0.0–40.0)

## 2023-08-14 MED ORDER — LOSARTAN POTASSIUM 25 MG PO TABS: 25.0000 mg | ORAL_TABLET | Freq: Every day | ORAL | 3 refills | Status: AC

## 2023-08-14 NOTE — Progress Notes (Signed)
 Subjective:    Patient ID: Eduardo Powell, male    DOB: [birth year], 43 y.o.   MRN: [id number]  HPI  Pt in for wellness exam.   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 Pt not exercising. Pt trying to eat healthier.  Non smoker. About 2-3 beers a week. Drinks 2 cups of coffee a day.      Pt declines flu vaccine.    Htn- on losartan  25 mg daily. Pb is controlled.  Review of Systems  Constitutional:  Negative for chills, fatigue and fever.  HENT:  Negative for congestion, ear discharge, facial swelling and mouth sores.   Respiratory:  Negative for cough, chest tightness, shortness of breath and wheezing.   Cardiovascular:  Negative for palpitations.  Gastrointestinal:  Negative for abdominal pain, constipation and nausea.  Genitourinary:  Negative for dysuria, frequency and hematuria.  Musculoskeletal:  Negative for back pain, myalgias and neck stiffness.  Skin:  Negative for rash.  Neurological:  Negative for dizziness, seizures, syncope, weakness and light-headedness.  Hematological:  Negative for adenopathy. Does not bruise/bleed easily.  Psychiatric/Behavioral:  Negative for behavioral problems, decreased concentration and hallucinations. The patient is not nervous/anxious.     No past medical history on file.   Social History   Socioeconomic History   Marital status: Married    Spouse name: Not on file   Number of children: Not on file   Years of education: Not on file   Highest education level: Not on file  Occupational History   Not on file  Tobacco Use   Smoking status: Never   Smokeless tobacco: Never  Vaping Use   Vaping status: Never Used  Substance and Sexual Activity   Alcohol use: Yes    Alcohol/week: 12.0 standard drinks of alcohol    Types: 12 Cans of beer per week    Comment: ususally spread out over a week.   Drug use: Never   Sexual activity: Yes  Other Topics Concern   Not on file  Social History Narrative   ** Merged History  Encounter **       Social Drivers of Health   Financial Resource Strain: Not on file  Food Insecurity: Low Risk  (05/09/2023)   Received from [facility]   Hunger Vital Sign    Worried About Running Out of Food in the Last Year: Never true    Ran Out of Food in the Last Year: Never true  Transportation Needs: No Transportation Needs (05/09/2023)   Received from [facility]    In the past 12 months, has lack of reliable transportation kept you from medical appointments, meetings, work or from getting things needed for daily living? : No  Physical Activity: Not on file  Stress: Not on file  Social Connections: Unknown (03/13/2022)   Received from [facility], [facility]   Social Network    Social Network: Not on file  Intimate Partner Violence: Unknown (03/13/2022)   Received from [facility], [facility]   HITS    Physically Hurt: Not on file    Insult or Talk Down To: Not on file    Threaten Physical Harm: Not on file    Scream or Curse: Not on file    Past Surgical History:  Procedure Laterality Date   CHOLECYSTECTOMY     rt side acl surgery      Family History  Problem Relation Age of Onset   Cancer Father  Cancer Other     Not on File  Current Outpatient Medications on File Prior to Visit  Medication Sig Dispense Refill   losartan  (COZAAR ) 25 MG tablet Take 1 tablet (25 mg total) by mouth daily. 90 tablet 3   No current facility-administered medications on file prior to visit.    BP 130/80   Pulse 70   Resp 18   Ht 5' 11 (1.803 m)   Wt (!) 306 lb (138.8 kg)   SpO2 96%   BMI 42.68 kg/m        Objective:   Physical Exam   General Mental Status- Alert. General Appearance- Not in acute distress.   Skin General: Color- Normal Color. Moisture- Normal Moisture.  Neck Carotid Arteries- Normal color. Moisture- Normal Moisture. No carotid bruits. No JVD.  Chest and Lung Exam Auscultation: Breath  Sounds:-CTA  Cardiovascular Auscultation:Rythm- RRR Murmurs & Other Heart Sounds:Auscultation of the heart reveals- No Murmurs.  Abdomen Inspection:-Inspeection Normal. Palpation/Percussion:Note:No mass. Palpation and Percussion of the abdomen reveal- Non Tender, Non Distended + BS, no rebound or guarding.   Neurologic Cranial Nerve exam:- CN III-XII intact(No nystagmus), symmetric smile. Strength:- 5/5 equal and symmetric strength both upper and lower extremities.      Assessment & Plan:   For you wellness exam today I have ordered cbc, cmp and  lipid panel.    Flu vaccine declined.   Recommend exercise and healthy diet.   We will let you know lab results as they come in.   Follow up date appointment will be determined after lab review.   Htn- bp controlled. Refilled losartan  25 mg daily.   Chinara Hertzberg, PA-C

## 2023-08-14 NOTE — Patient Instructions (Signed)
 For you wellness exam today I have ordered cbc, cmp and  lipid panel.    Flu vaccine declined.   Recommend exercise and healthy diet.   We will let you know lab results as they come in.   Follow up date appointment will be determined after lab review.   Htn- bp controlled. Refilled losartan  25 mg daily.   Preventive Care 70-43 Years Old, Male Preventive care refers to lifestyle choices and visits with your health care provider that can promote health and wellness. Preventive care visits are also called wellness exams. What can I expect for my preventive care visit? Counseling During your preventive care visit, your health care provider may ask about your: Medical history, including: Past medical problems. Family medical history. Current health, including: Emotional well-being. Home life and relationship well-being. Sexual activity. Lifestyle, including: Alcohol, nicotine or tobacco, and drug use. Access to firearms. Diet, exercise, and sleep habits. Safety issues such as seatbelt and bike helmet use. Sunscreen use. Work and work astronomer. Physical exam Your health care provider will check your: Height and weight. These may be used to calculate your BMI (body mass index). BMI is a measurement that tells if you are at a healthy weight. Waist circumference. This measures the distance around your waistline. This measurement also tells if you are at a healthy weight and may help predict your risk of certain diseases, such as type 2 diabetes and high blood pressure. Heart rate and blood pressure. Body temperature. Skin for abnormal spots. What immunizations do I need?  Vaccines are usually given at various ages, according to a schedule. Your health care provider will recommend vaccines for you based on your age, medical history, and lifestyle or other factors, such as travel or where you work. What tests do I need? Screening Your health care provider may recommend screening  tests for certain conditions. This may include: Lipid and cholesterol levels. Diabetes screening. This is done by checking your blood sugar (glucose) after you have not eaten for a while (fasting). Hepatitis B test. Hepatitis C test. HIV (human immunodeficiency virus) test. STI (sexually transmitted infection) testing, if you are at risk. Lung cancer screening. Prostate cancer screening. Colorectal cancer screening. Talk with your health care provider about your test results, treatment options, and if necessary, the need for more tests. Follow these instructions at home: Eating and drinking  Eat a diet that includes fresh fruits and vegetables, whole grains, lean protein, and low-fat dairy products. Take vitamin and mineral supplements as recommended by your health care provider. Do not drink alcohol if your health care provider tells you not to drink. If you drink alcohol: Limit how much you have to 0-2 drinks a day. Know how much alcohol is in your drink. In the [place] one drink equals one 12 oz bottle of beer (355 mL), one 5 oz glass of wine (148 mL), or one 1 oz glass of hard liquor (44 mL). Lifestyle Brush your teeth every morning and night with fluoride toothpaste. Floss one time each day. Exercise for at least 30 minutes 5 or more days each week. Do not use any products that contain nicotine or tobacco. These products include cigarettes, chewing tobacco, and vaping devices, such as e-cigarettes. If you need help quitting, ask your health care provider. Do not use drugs. If you are sexually active, practice safe sex. Use a condom or other form of protection to prevent STIs. Take aspirin only as told by your health care provider. Make sure that you  understand how much to take and what form to take. Work with your health care provider to find out whether it is safe and beneficial for you to take aspirin daily. Find healthy ways to manage stress, such as: Meditation, yoga, or listening  to music. Journaling. Talking to a trusted person. Spending time with friends and family. Minimize exposure to UV radiation to reduce your risk of skin cancer. Safety Always wear your seat belt while driving or riding in a vehicle. Do not drive: If you have been drinking alcohol. Do not ride with someone who has been drinking. When you are tired or distracted. While texting. If you have been using any mind-altering substances or drugs. Wear a helmet and other protective equipment during sports activities. If you have firearms in your house, make sure you follow all gun safety procedures. What's next? Go to your health care provider once a year for an annual wellness visit. Ask your health care provider how often you should have your eyes and teeth checked. Stay up to date on all vaccines. This information is not intended to replace advice given to you by your health care provider. Make sure you discuss any questions you have with your health care provider. Document Revised: 01/16/2021 Document Reviewed: 01/16/2021 Elsevier Patient Education  2024 [employer].

## 2024-05-09 ENCOUNTER — Ambulatory Visit: Payer: PRIVATE HEALTH INSURANCE | Admitting: Medical

## 2024-05-09 ENCOUNTER — Ambulatory Visit: Payer: Self-pay | Admitting: Medical

## 2024-05-09 ENCOUNTER — Ambulatory Visit (HOSPITAL_BASED_OUTPATIENT_CLINIC_OR_DEPARTMENT_OTHER)
Admission: RE | Admit: 2024-05-09 | Discharge: 2024-05-09 | Disposition: A | Discharge status: Home / Self Care | Payer: PRIVATE HEALTH INSURANCE | Source: Ambulatory Visit | Attending: Medical | Admitting: Medical

## 2024-05-09 VITALS — BP 130/80 | HR 74 | Temp 98.0°F | Resp 16 | Ht 71.0 in | Wt 310.2 lb

## 2024-05-09 DIAGNOSIS — I10 Essential (primary) hypertension: Secondary | ICD-10-CM

## 2024-05-09 DIAGNOSIS — M545 Low back pain, unspecified: Secondary | ICD-10-CM | POA: Insufficient documentation

## 2024-05-09 DIAGNOSIS — Z3009 Encounter for other general counseling and advice on contraception: Secondary | ICD-10-CM | POA: Diagnosis not present

## 2024-05-09 DIAGNOSIS — G8929 Other chronic pain: Secondary | ICD-10-CM | POA: Diagnosis present

## 2024-05-09 MED ORDER — TIRZEPATIDE-WEIGHT MANAGEMENT 2.5 MG/0.5ML ~~LOC~~ SOLN: 2.5000 mg | SUBCUTANEOUS | 0 refills | Status: DC

## 2024-05-09 NOTE — Progress Notes (Signed)
 Subjective:    Patient ID: Eduardo Powell, male    DOB: 02-02-1981, 43 y.o.   MRN: [id number]  HPI  Zakar Brosch is a 43 year old male who presents with weight gain and chronic back pain.  He has experienced significant weight gain, increasing from 275 pounds in 2023 to 310 pounds currently. Despite efforts to improve his diet, he has been unable to lose weight. In 2023, he successfully lost weight by making extreme dietary changes, but his weight plateaued at 270 pounds before gradually increasing to the current level. No history of sleep apnea, pancreatitis, or thyroid cancer. He request glp1 injection to lose weight.  He has a history of chronic back pain since 2006, described as his pelvis getting 'stuck'. Chiropractic visits typically provide relief, but recently, despite attending sessions two to three times a week for the past month, he has not experienced the usual relief. The pain is localized to his pelvis and lower back, described more as soreness than pain, and does not radiate to his legs. Prolonged sitting exacerbates the discomfort, requiring stretching to alleviate stiffness. He last had an x-ray at a chiropractor's office about two years ago. He does not take regular medication for pain, occasionally using ibuprofen.  He is currently taking losartan  for blood pressure, and his most recent reading was 130/80 mmHg. He works in [employer] as a Merchandiser, retail, which involves long periods of sitting and commuting an hour each way to work, limiting his time for exercise. He attempted to go to the gym during the summer but only managed twice a week. No pain radiating to legs, no snoring, denies history of sleep apnea, pancreatitis, or thyroid cancer.          Review of Systems  Constitutional:  Negative for chills, fatigue and fever.  HENT:  Negative for congestion, ear discharge and ear pain.   Respiratory:  Negative for chest tightness, shortness of breath and wheezing.    Cardiovascular:  Negative for chest pain and palpitations.  Gastrointestinal:  Negative for abdominal pain and diarrhea.  Genitourinary:        Also pt desires referral for vasectomy.  Musculoskeletal:  Positive for back pain. Negative for myalgias and neck stiffness.  Skin:  Negative for rash.  Hematological:  Negative for adenopathy.  Psychiatric/Behavioral:  Negative for behavioral problems, dysphoric mood and sleep disturbance.       No past medical history on file.   Social History   Socioeconomic History   Marital status: Married    Spouse name: Not on file   Number of children: Not on file   Years of education: Not on file   Highest education level: Not on file  Occupational History   Not on file  Tobacco Use   Smoking status: Never   Smokeless tobacco: Never  Vaping Use   Vaping status: Never Used  Substance and Sexual Activity   Alcohol use: Yes    Alcohol/week: 12.0 standard drinks of alcohol    Types: 12 Cans of beer per week    Comment: ususally spread out over a week.   Drug use: Never   Sexual activity: Yes  Other Topics Concern   Not on file  Social History Narrative   ** Merged History Encounter **       Social Drivers of Health   Financial Resource Strain: Not on file  Food Insecurity: Low Risk  (05/09/2023)   Received from [facility]   Hunger Vital Sign  Within the past 12 months, you worried that your food would run out before you got money to buy more: Never true    Within the past 12 months, the food you bought just didn't last and you didn't have money to get more. : Never true  Transportation Needs: No Transportation Needs (05/09/2023)   Received from [facility]    In the past 12 months, has lack of reliable transportation kept you from medical appointments, meetings, work or from getting things needed for daily living? : No  Physical Activity: Not on file  Stress: Not on file  Social Connections: Unknown  (03/13/2022)   Received from [facility]   Social Network    Social Network: Not on file  Intimate Partner Violence: Unknown (03/13/2022)   Received from [facility]   HITS    Physically Hurt: Not on file    Insult or Talk Down To: Not on file    Threaten Physical Harm: Not on file    Scream or Curse: Not on file    Past Surgical History:  Procedure Laterality Date   CHOLECYSTECTOMY     rt side acl surgery      Family History  Problem Relation Age of Onset   Cancer Father    Cancer Other     Not on File  Current Outpatient Medications on File Prior to Visit  Medication Sig Dispense Refill   losartan  (COZAAR ) 25 MG tablet Take 1 tablet (25 mg total) by mouth daily. 90 tablet 3   No current facility-administered medications on file prior to visit.    BP 130/80   Pulse 74   Temp 98 F (36.7 C) (Oral)   Resp 16   Ht 5' 11 (1.803 m)   Wt (!) 310 lb 3.2 oz (140.7 kg)   SpO2 95%   BMI 43.26 kg/m           Objective:   Physical Exam  General Appearance- Not in acute distress.    Chest and Lung Exam Auscultation: Breath sounds:-Normal. Clear even and unlabored. Adventitious sounds:- No Adventitious sounds.  Cardiovascular Auscultation:Rythm - Regular, rate and rythm. Heart Sounds -Normal heart sounds.  Abdomen Inspection:-Inspection Normal.  Palpation/Perucssion: Palpation and Percussion of the abdomen reveal- Non Tender, No Rebound tenderness, No rigidity(Guarding) and No Palpable abdominal masses.  Liver:-Normal.  Spleen:- Normal.   Back Mid lumbar spine tenderness to palpation. Pain on straight leg lift. Pain on lateral movements and flexion/extension of the spine.  Lower ext neurologic  L5-S1 sensation intact bilaterally. Normal patellar reflexes bilaterally. No foot drop bilaterally.       Assessment & Plan:  Obesity Obesity with significant weight gain. Discussed weight loss medications and emphasized combining them with exercise  and diet. Addressed insurance coverage for medications. - Send prescription for Zepbound and check insurance coverage. - Encourage commitment to exercise, aiming for 6,000 to 10,000 steps daily. - Advise adherence to a healthy diet similar to previous successful regimen. - Consider metformin if Zepbound is not covered by insurance.  Chronic low back. Chronic pain exacerbated recently, limited relief from chiropractic care. Discussed weight loss benefits and potential referrals. - Order lumbar spine x-ray. - Provide back stretching exercises. - Consider referral to sports medicine or physical therapy if pain persists.  Hypertension Hypertension well-controlled with losartan . - Continue current losartan  regimen.  Type 2 diabetes mellitus without complications Type 2 diabetes mellitus with controlled blood sugar levels.  General Health Maintenance Discussed importance of regular  exercise and healthy lifestyle. - Encourage regular physical activity and healthy lifestyle choices.  Also place referral for vasectomy.  Follow up one month or sooner if needed  [employer], PA-C

## 2024-05-09 NOTE — Patient Instructions (Addendum)
 Obesity Obesity with significant weight gain. Discussed weight loss medications and emphasized combining them with exercise and diet. Addressed insurance coverage for medications. - Send prescription for Zepbound and check insurance coverage. - Encourage commitment to exercise, aiming for 6,000 to 10,000 steps daily. - Advise adherence to a healthy diet similar to previous successful regimen. - Consider metformin if Zepbound is not covered by insurance.  Chronic low back. Chronic pain exacerbated recently, limited relief from chiropractic care. Discussed weight loss benefits and potential referrals. - Order lumbar spine x-ray. - Provide back stretching exercises. - Consider referral to sports medicine or physical therapy if pain persists.  Hypertension Hypertension well-controlled with losartan . - Continue current losartan  regimen.  Type 2 diabetes mellitus without complications Type 2 diabetes mellitus with controlled blood sugar levels.  General Health Maintenance Discussed importance of regular exercise and healthy lifestyle. - Encourage regular physical activity and healthy lifestyle choices.  Also place referral for vasectomy.  Follow up one month or sooner if needed  Back Exercises These exercises help to make your trunk and back strong. They also help to keep the lower back flexible. Doing these exercises can help to prevent or lessen pain in your lower back. If you have back pain, try to do these exercises 2-3 times each day or as told by your doctor. As you get better, do the exercises once each day. Repeat the exercises more often as told by your doctor. To stop back pain from coming back, do the exercises once each day, or as told by your doctor. Do exercises exactly as told by your doctor. Stop right away if you feel sudden pain or your pain gets worse. Exercises Single knee to chest Do these steps 3-5 times in a row for each leg: Lie on your back on a firm bed or the  floor with your legs stretched out. Bring one knee to your chest. Grab your knee or thigh with both hands and hold it in place. Pull on your knee until you feel a gentle stretch in your lower back or butt. Keep doing the stretch for 10-30 seconds. Slowly let go of your leg and straighten it. Pelvic tilt Do these steps 5-10 times in a row: Lie on your back on a firm bed or the floor with your legs stretched out. Bend your knees so they point up to the ceiling. Your feet should be flat on the floor. Tighten your lower belly (abdomen) muscles to press your lower back against the floor. This will make your tailbone point up to the ceiling instead of pointing down to your feet or the floor. Stay in this position for 5-10 seconds while you gently tighten your muscles and breathe evenly. Cat-cow Do these steps until your lower back bends more easily: Get on your hands and knees on a firm bed or the floor. Keep your hands under your shoulders, and keep your knees under your hips. You may put padding under your knees. Let your head hang down toward your chest. Tighten (contract) the muscles in your belly. Point your tailbone toward the floor so your lower back becomes rounded like the back of a cat. Stay in this position for 5 seconds. Slowly lift your head. Let the muscles of your belly relax. Point your tailbone up toward the ceiling so your back forms a sagging arch like the back of a cow. Stay in this position for 5 seconds.  Press-ups Do these steps 5-10 times in a row: Lie on your  belly (face-down) on a firm bed or the floor. Place your hands near your head, about shoulder-width apart. While you keep your back relaxed and keep your hips on the floor, slowly straighten your arms to raise the top half of your body and lift your shoulders. Do not use your back muscles. You may change where you place your hands to make yourself more comfortable. Stay in this position for 5 seconds. Keep your back  relaxed. Slowly return to lying flat on the floor.  Bridges Do these steps 10 times in a row: Lie on your back on a firm bed or the floor. Bend your knees so they point up to the ceiling. Your feet should be flat on the floor. Your arms should be flat at your sides, next to your body. Tighten your butt muscles and lift your butt off the floor until your waist is almost as high as your knees. If you do not feel the muscles working in your butt and the back of your thighs, slide your feet 1-2 inches (2.5-5 cm) farther away from your butt. Stay in this position for 3-5 seconds. Slowly lower your butt to the floor, and let your butt muscles relax. If this exercise is too easy, try doing it with your arms crossed over your chest. Belly crunches Do these steps 5-10 times in a row: Lie on your back on a firm bed or the floor with your legs stretched out. Bend your knees so they point up to the ceiling. Your feet should be flat on the floor. Cross your arms over your chest. Tip your chin a little bit toward your chest, but do not bend your neck. Tighten your belly muscles and slowly raise your chest just enough to lift your shoulder blades a tiny bit off the floor. Avoid raising your body higher than that because it can put too much stress on your lower back. Slowly lower your chest and your head to the floor. Back lifts Do these steps 5-10 times in a row: Lie on your belly (face-down) with your arms at your sides, and rest your forehead on the floor. Tighten the muscles in your legs and your butt. Slowly lift your chest off the floor while you keep your hips on the floor. Keep the back of your head in line with the curve in your back. Look at the floor while you do this. Stay in this position for 3-5 seconds. Slowly lower your chest and your face to the floor. Contact a doctor if: Your back pain gets a lot worse when you do an exercise. Your back pain does not get better within 2 hours after you  exercise. If you have any of these problems, stop doing the exercises. Do not do them again unless your doctor says it is okay. Get help right away if: You have sudden, very bad back pain. If this happens, stop doing the exercises. Do not do them again unless your doctor says it is okay. This information is not intended to replace advice given to you by your health care provider. Make sure you discuss any questions you have with your health care provider. Document Revised: 10/03/2020 Document Reviewed: 10/03/2020 Elsevier Patient Education  2024 [employer].

## 2024-05-10 ENCOUNTER — Other Ambulatory Visit: Payer: Self-pay

## 2024-05-10 ENCOUNTER — Encounter: Payer: Self-pay | Admitting: Medical

## 2024-05-10 ENCOUNTER — Telehealth: Payer: Self-pay

## 2024-05-10 ENCOUNTER — Other Ambulatory Visit (HOSPITAL_COMMUNITY): Payer: Self-pay

## 2024-05-10 MED ORDER — ZEPBOUND 2.5 MG/0.5ML ~~LOC~~ SOAJ: 2.5000 mg | SUBCUTANEOUS | 0 refills | Status: DC

## 2024-05-10 MED ORDER — TIRZEPATIDE-WEIGHT MANAGEMENT 2.5 MG/0.5ML ~~LOC~~ SOAJ: 2.5000 mg | Freq: Once | SUBCUTANEOUS | Status: DC

## 2024-05-10 NOTE — Telephone Encounter (Signed)
 Pharmacy Patient [facility]ocate Encounter  Received notification from [facility] [employer] that Prior Authorization for Zepbound 2.5MG /0.5ML pen-injectors has been APPROVED from 05/10/24 to 05/10/25. Ran test claim, Copay is $45. This test claim was processed through [facility]- copay amounts may vary at other pharmacies due to pharmacy/plan contracts, or as the patient moves through the different stages of their insurance plan.   PA #/Case ID/Reference #: [id number]

## 2024-05-10 NOTE — Addendum Note (Signed)
 Addended by: GERARD CHUCKIE SAILOR on: 05/10/2024 02:46 PM   Modules accepted: Orders

## 2024-05-10 NOTE — Telephone Encounter (Signed)
 Pharmacy Patient Advocate Encounter   Received notification from Patient Advice Request messages that prior authorization for Zepbound 2.5MG /0.5ML pen-injectors is required/requested.   Insurance verification completed.   The patient is insured through [employer].   Per test claim: PA required; PA submitted to above mentioned insurance via Latent Key/confirmation #/EOC [id number] Status is pending

## 2024-06-06 ENCOUNTER — Ambulatory Visit: Payer: PRIVATE HEALTH INSURANCE | Admitting: Medical

## 2024-06-06 DIAGNOSIS — I10 Essential (primary) hypertension: Secondary | ICD-10-CM

## 2024-06-06 DIAGNOSIS — G8929 Other chronic pain: Secondary | ICD-10-CM

## 2024-06-06 DIAGNOSIS — Z3009 Encounter for other general counseling and advice on contraception: Secondary | ICD-10-CM | POA: Diagnosis not present

## 2024-06-06 DIAGNOSIS — M545 Low back pain, unspecified: Secondary | ICD-10-CM | POA: Diagnosis not present

## 2024-06-06 MED ORDER — ZEPBOUND 2.5 MG/0.5ML ~~LOC~~ SOAJ: 2.5000 mg | SUBCUTANEOUS | 0 refills | Status: DC

## 2024-06-06 NOTE — Progress Notes (Signed)
 Subjective:    Patient ID: Eduardo Powell, male    DOB: 1981/05/25, 43 y.o.   MRN: [id number]  HPI  Pt in for follow up on last visit.  Obesity Obesity with significant weight gain. Discussed weight loss medications and emphasized combining them with exercise and diet. Addressed insurance coverage for medications. - Send prescription for Zepbound and check insurance coverage. - Encourage commitment to exercise, aiming for 6,000 to 10,000 steps daily. - Advise adherence to a healthy diet similar to previous successful regimen. - Consider metformin if Zepbound is not covered by insurance.   Chronic low back. Chronic pain exacerbated recently, limited relief from chiropractic care. Discussed weight loss benefits and potential referrals. - Order lumbar spine x-ray. - Provide back stretching exercises. - Consider referral to sports medicine or physical therapy if pain persists.   Hypertension Hypertension well-controlled with losartan . - Continue current losartan  regimen.   Type 2 diabetes mellitus without complications Type 2 diabetes mellitus with controlled blood sugar levels.   General Health Maintenance Discussed importance of regular exercise and healthy lifestyle. - Encourage regular physical activity and healthy lifestyle choices.   Also place referral for vasectomy.   Follow up one month or sooner if needed      Dillinger Aston is a 43 year old male with obesity and chronic back pain who presents for follow-up on weight management and back pain.  He has been experiencing chronic back pain, which he describes as 'pretty good' recently. He has stopped visiting the chiropractor since last week due to rain but reports sleeping well. A previous x-ray showed degenerative changes.  For weight management, he has been using Zepbound, which is covered by insurance with a copay of $25 after a coupon. He has lost approximately 10 to 12 pounds, starting from 303 pounds and now  weighing 290 pounds at home. He notes a discrepancy in weight when wearing steel toe boots and reports no side effects from the medication.  His blood pressure is well-controlled at 122/80 while on losartan .  He is scheduled for a vasectomy consultation on Wednesday this week.  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   Review of Systems  Constitutional:  Negative for chills and fever.  HENT:  Negative for dental problem, ear pain and mouth sores.   Respiratory:  Negative for chest tightness, shortness of breath and wheezing.   Cardiovascular:  Negative for chest pain and palpitations.  Gastrointestinal:  Negative for abdominal pain and diarrhea.  Genitourinary:  Negative for dysuria and genital sores.  Musculoskeletal:  Negative for gait problem and neck stiffness.  Skin:  Negative for rash.  Neurological:  Negative for dizziness and syncope.  Hematological:  Negative for adenopathy.  Psychiatric/Behavioral:  Negative for behavioral problems, dysphoric mood and self-injury.     No past medical history on file.   Social History   Socioeconomic History   Marital status: Married    Spouse name: Not on file   Number of children: Not on file   Years of education: Not on file   Highest education level: Not on file  Occupational History   Not on file  Tobacco Use   Smoking status: Never   Smokeless tobacco: Never  Vaping Use   Vaping status: Never Used  Substance and Sexual Activity   Alcohol use: Yes    Alcohol/week: 12.0 standard drinks of alcohol    Types: 12 Cans of beer per week    Comment: ususally spread  out over a week.   Drug use: Never   Sexual activity: Yes  Other Topics Concern   Not on file  Social History Narrative   ** Merged History Encounter **       Social Drivers of Health   Financial Resource Strain: Not on file  Food Insecurity: Low Risk  (05/09/2023)   Received from [facility]   Hunger Vital Sign    Within the  past 12 months, you worried that your food would run out before you got money to buy more: Never true    Within the past 12 months, the food you bought just didn't last and you didn't have money to get more. : Never true  Transportation Needs: No Transportation Needs (05/09/2023)   Received from [facility]    In the past 12 months, has lack of reliable transportation kept you from medical appointments, meetings, work or from getting things needed for daily living? : No  Physical Activity: Not on file  Stress: Not on file  Social Connections: Unknown (03/13/2022)   Received from [facility]   Social Network    Social Network: Not on file  Intimate Partner Violence: Unknown (03/13/2022)   Received from [facility]   HITS    Physically Hurt: Not on file    Insult or Talk Down To: Not on file    Threaten Physical Harm: Not on file    Scream or Curse: Not on file    Past Surgical History:  Procedure Laterality Date   CHOLECYSTECTOMY     rt side acl surgery      Family History  Problem Relation Age of Onset   Cancer Father    Cancer Other     Not on File  Current Outpatient Medications on File Prior to Visit  Medication Sig Dispense Refill   losartan  (COZAAR ) 25 MG tablet Take 1 tablet (25 mg total) by mouth daily. 90 tablet 3   tirzepatide (ZEPBOUND) 2.5 MG/0.5ML Pen Inject 2.5 mg into the skin once a week. 2 mL 0   No current facility-administered medications on file prior to visit.    BP 122/80   Pulse 74   Temp 98.1 F (36.7 C) (Oral)   Resp 15   Ht 5' 11 (1.803 m)   Wt 299 lb 3.2 oz (135.7 kg)   SpO2 96%   BMI 41.73 kg/m         Objective:   Physical Exam  General Mental Status- Alert. General Appearance- Not in acute distress.   Skin General: Color- Normal Color. Moisture- Normal Moisture.  Neck Carotid Arteries- Normal color. Moisture- Normal Moisture. No carotid bruits. No JVD.  Chest and Lung Exam Auscultation: Breath  Sounds:-Normal.  Cardiovascular Auscultation:Rythm- Regular. Murmurs & Other Heart Sounds:Auscultation of the heart reveals- No Murmurs.  Abdomen Inspection:-Inspeection Normal. Palpation/Percussion:Note:No mass. Palpation and Percussion of the abdomen reveal- Non Tender, Non Distended + BS, no rebound or guarding.   Neurologic Cranial Nerve exam:- CN III-XII intact(No nystagmus), symmetric smile. Strength:- 5/5 equal and symmetric strength both upper and lower extremities.       Assessment & Plan:   Patient Instructions  Obesity Obesity with successful weight loss of 10-12 lbs since starting Zepbound. No side effects reported. Insurance coverage adequate. - Continue Zepbound at current dose. - Monitor weight and report any plateau or lack of weight loss. - Contact provider via MyChart 3-4 days before medication runs out to discuss weight progress and potential dose  adjustment.  Essential hypertension Hypertension well-controlled with losartan . Blood pressure 122/80 mmHg. - Continue losartan .  Degenerative changes of lumbar spine Degenerative changes. Back pain improved.  Follow up by my chart in about one month. Let me know on weight change or side effect. Then decide on dosage of zepbound.   Jackston [place], PA-C

## 2024-06-06 NOTE — Patient Instructions (Addendum)
 Obesity Obesity with successful weight loss of 10-12 lbs since starting Zepbound. No side effects reported. Insurance coverage adequate. - Continue Zepbound at current dose. - Monitor weight and report any plateau or lack of weight loss. - Contact provider via MyChart 3-4 days before medication runs out to discuss weight progress and potential dose adjustment.  Essential hypertension Hypertension well-controlled with losartan . Blood pressure 122/80 mmHg. - Continue losartan .  Degenerative changes of lumbar spine Degenerative changes. Back pain improved.  Follow up by my chart in about one month. Let me know on weight change or side effect. Then decide on dosage of zepbound.

## 2024-06-08 ENCOUNTER — Encounter: Payer: Self-pay | Admitting: Urology

## 2024-06-08 ENCOUNTER — Ambulatory Visit: Payer: PRIVATE HEALTH INSURANCE | Admitting: Urology

## 2024-06-08 VITALS — BP 117/82 | HR 76 | Ht 71.0 in | Wt 289.0 lb

## 2024-06-08 DIAGNOSIS — Z3009 Encounter for other general counseling and advice on contraception: Secondary | ICD-10-CM

## 2024-06-08 MED ORDER — ALPRAZOLAM 1 MG PO TABS: ORAL_TABLET | ORAL | 0 refills | Status: AC

## 2024-06-08 NOTE — Patient Instructions (Signed)
 Vasectomy is a safe, simple, and effective office based procedure that provides men with permanent sterility.  The no scalpel technique has been a refinement but often results in less swelling and pain than the traditional vasectomy method.  Prior to a vasectomy, it is important to make a decision that you are interested in permanent sterility and that you and your partner must be completely sure that you do not want children in the future.  To prepare for the procedure, stop taking any aspirin or blood thinners for 1 week prior to the procedure.  The day of your procedure take a shower and thoroughly clean your scrotum.  It is not necessary to shave prior to the procedure as any shaving that is needed will be performed in the office.  Bring a pair of tight underwear such as briefs, boxer briefs, or athletic shorts with you for the day of the procedure.  You may eat a light meal prior to the procedure.  During the procedure, the scrotal skin will be sterilized completely.  Anesthesia will be provided by injection of a local anesthetic into the scrotum which will provide anesthesia for 2-3 hours after the procedure.  Once the local anesthetic takes effect, a tiny puncture is made in the middle of the front of the scrotum through which both vasa deferens tubes can be partially removed and the ends of the tubes cauterized.  A small absorbable suture is placed in the skin incision.  Antibiotic ointment and sterile gauze dressings are applied and are held in place with the undergarment.  Prescriptions for an antibiotic and pain medication will be sent to your pharmacy.  The antibiotic should be taken to completion.  The pain medication can be taken as needed every 4-6 hours.  If a narcotic pain medication is too strong, over-the-counter analgesics such as Tylenol or ibuprofen may be taken instead.  After the procedure, it is important to take it easy for a couple of days and apply the ice pack to the scrotal area.   The ice pack goes on top of the undergarment and should be used for 10-15 minutes at a time while you are awake.  After the first 2 days, you can gradually increase your activity.  During the first week, it is important to avoid strenuous activity or activity that puts pressure in the scrotal area.  It is also advisable to restrain from heavy lifting or long distance running during this time period.  Often, supportive underwear is helpful to reduce pain during the first week.  You should also avoid sexual activity for 10 days.  You can resume normal activities after 1 week and sexual relations in 10 days.  One of the most important considerations after vasectomy is that you are still fertile after the procedure.  It generally takes at least 20 ejaculations and 3 months time before you are considered sterile.  Even 3 months after the procedure, some men will have a few persistent sperm present.  To be considered sterile, you will need to produce a semen sample that shows no sperm.  You will receive instructions to bring in your first semen specimen to the office 3 months after the procedure.  It is very important that you continue to use your current method of birth control during this time as it is possible to achieve a pregnancy until you become sterile.  Potential complications of vasectomy include bleeding (less than 1% risk of serious bleeding), infection (less than 1%), reconnection of  the vas deferens (08/998 chance), pregnancy (08/1998 chance), shrinkage of the testicle (08/4998 chance), and chronic pain (2-4%).  Other potential consequences of vasectomy include sperm granuloma (a small round area of scar tissue in the area of the procedure is performed), and congestion of the epididymis (a fullness of the tubes where sperm are stored).  Sperm will continue to be produced by the testicles, but the sperm will eventually die and be absorbed by the body.  The amount of semen that is produced will not change.   The main difference is that the semen will not contain sperm after a man has become sterile.  The procedure does not affect your urination, sex drive, or erections.  In summary, vasectomy provides permanent birth control for men.  It is an office-based procedure which is safe, effective, and economical.  After the procedure, it takes time to become sterile so proper precautions must be taken until sterility is achieved.    Taking care of yourself after a VASECTOMY                                              Patient Information Sheet        The following information will reinforce some of the instructions that your doctor has given you.  Day of Procedure: 1) Wear the scrotal supporter and gauze pad 2) Use an ice pack on the scrotum for 15 minutes every hour for 48 hours to help reduce discomfort, swelling and bruising (do NOT place ice directly on your skin, but place on top of the supporter) 3) Expect some clear to pinkish drainage at the surgical site for the first 24-48 hours 4) If needed, use pain medications provided or ibuprofen 800 mg every 8 hours for discomfort 5) Avoid strenuous activities like mowing, lifting, jogging and exercising for 1 week.  Take it easy! 6) If you develop a fever over 101 F or sudden onset of significant swelling within the first 12 hours, please call to report this to your doctor as soon as possible.     Day Two and Three: 1) You may take a shower, but avoid tubs, pools or hot tubs. 2) Continue to wear the scrotal supporter as needed for comfort and change or remove the gauze pad if desired 3) Keep taking it easy!  Avoid strenuous activities like mowing, lifting, jogging and exercising.   4) Continue to watch for signs or symptoms of fever or significant swelling 5) Apply a small amount of antibiotic ointment to incision 1-2 times/day  The rest of the week: 1) Gradually return to normal physical activities after one week.  A return of soreness might  mean you are        "doing too much too soon". 2) Avoid sexual activity for 10 days after the procedure 3) Continue to take a shower, but avoid tubs, pools or hot tubs 4) Wearing the scrotal supporter is optional based on your comfort.     Remember to use an alternate form of contraception for 3 months until you have been checked and CLEARED by your urologist!  62-Month lab appointment:  1) The lab technician will need to look at a semen sample under a microscope  2) Use the specimen cup provided to collect the sample AT HOME 1 hour before the appointment  3) DO NOT refrigerate the specimen, but keep  at room or body temperature  4) Avoid ejaculation for 2-5 days before collecting the specimen  5) Collect the entire specimen by masturbation using NO lubricant  6) Make sure your name, MR number, date and time of collection are on the cup

## 2024-06-08 NOTE — Progress Notes (Signed)
 Assessment: 1. Encounter for vasectomy assessment     Plan: Schedule for vasectomy per patient request Rx for alprazolam 1 mg pre-procedure provided.   Chief Complaint:  Chief Complaint  Patient presents with   VAS Consult    History of Present Illness:  Eduardo Powell is a 43 y.o. male who is seen for vasectomy evaluation He is married with 2 children.  No history of scrotal trauma or infection.  Past Medical History:  Past Medical History:  Diagnosis Date   Hypertension     Past Surgical History:  Past Surgical History:  Procedure Laterality Date   CHOLECYSTECTOMY     rt side acl surgery      Allergies:  Not on File  Family History:  Family History  Problem Relation Age of Onset   Cancer Father    Cancer Other     Social History:  Social History   Tobacco Use   Smoking status: Never   Smokeless tobacco: Never  Vaping Use   Vaping status: Never Used  Substance Use Topics   Alcohol use: Yes    Alcohol/week: 12.0 standard drinks of alcohol    Types: 12 Cans of beer per week    Comment: ususally spread out over a week.   Drug use: Never    Review of symptoms:  Constitutional:  Negative for unexplained weight loss, night sweats, fever, chills ENT:  Negative for nose bleeds, sinus pain, painful swallowing CV:  Negative for chest pain, shortness of breath, exercise intolerance, palpitations, loss of consciousness Resp:  Negative for cough, wheezing, shortness of breath GI:  Negative for nausea, vomiting, diarrhea, bloody stools GU:  Positives noted in HPI; otherwise negative for gross hematuria, dysuria, urinary incontinence Neuro:  Negative for seizures, poor balance, limb weakness, slurred speech Psych:  Negative for lack of energy, depression, anxiety Endocrine:  Negative for polydipsia, polyuria, symptoms of hypoglycemia (dizziness, hunger, sweating) Hematologic:  Negative for anemia, purpura, petechia, prolonged or excessive bleeding, use of  anticoagulants  Allergic:  Negative for difficulty breathing or choking as a result of exposure to anything; no shellfish allergy; no allergic response (rash/itch) to materials, foods  Physical exam: BP 117/82   Pulse 76   Ht 5' 11 (1.803 m)   Wt 289 lb (131.1 kg)   BMI 40.31 kg/m  GENERAL APPEARANCE:  Well appearing, well developed, well nourished, NAD HEENT:  Atraumatic, normocephalic, oropharynx clear NECK:  Supple without lymphadenopathy or thyromegaly ABDOMEN:  Soft, non-tender, no masses EXTREMITIES:  Moves all extremities well, without clubbing, cyanosis, or edema NEUROLOGIC:  Alert and oriented x 3, normal gait, CN II-XII grossly intact MENTAL STATUS:  appropriate BACK:  Non-tender to palpation, No CVAT SKIN:  Warm, dry, and intact GU: Penis:  circumcised Meatus: Normal Scrotum: vas palpated bilaterally Testis: normal without masses right and bilateral  Results: None  VASECTOMY CONSULTATION  Eduardo Powell presents for vasectomy consultation today.  He is a 43 y.o. male, Married with 2 children.  He and his wife have discussed the issues regarding long-term fertility and are comfortable with this decision.  He presents for consideration for vasectomy.  I discussed the issues in detail with him today and he expressed no reservations.  As to the procedure, no scalpel technique vasectomy is explained and reviewed in detail.  Generalized risks including but not limited to bleeding, infection, orchalgia, testicular atrophy, epididymitis, scrotal hematoma, and chronic pain are discussed.   Additionally, he understands that the possibility of vas recanalization following vasectomy is  possible although rare.  Most importantly, the patient understands that he is not sterile initially and will need a semen analysis check to confirm sterility such that no sperm are seen.  He is advised to avoid ejaculation for 10 days following the procedure.  The initial semen analysis will be checked  in approximately 12 weeks and in some patients, several months may be required for clearance of all sperm.  He reports a clear understanding of the need for continued birth control until sterility is confirmed.  Otherwise, general issues regarding local anesthesia, prep, alprazolam are discussed and he reports a clear understanding.

## 2024-07-04 ENCOUNTER — Encounter: Payer: Self-pay | Admitting: Medical

## 2024-07-04 ENCOUNTER — Other Ambulatory Visit: Payer: Self-pay

## 2024-07-04 MED ORDER — ZEPBOUND 2.5 MG/0.5ML ~~LOC~~ SOAJ: 2.5000 mg | SUBCUTANEOUS | 0 refills | Status: DC

## 2024-07-13 ENCOUNTER — Ambulatory Visit (INDEPENDENT_AMBULATORY_CARE_PROVIDER_SITE_OTHER): Payer: PRIVATE HEALTH INSURANCE | Admitting: Urology

## 2024-07-13 ENCOUNTER — Encounter: Payer: Self-pay | Admitting: Urology

## 2024-07-13 VITALS — BP 111/72 | HR 73 | Ht 71.0 in | Wt 279.0 lb

## 2024-07-13 DIAGNOSIS — Z302 Encounter for sterilization: Secondary | ICD-10-CM | POA: Diagnosis not present

## 2024-07-13 MED ORDER — HYDROCODONE-ACETAMINOPHEN 5-325 MG PO TABS: 1.0000 | ORAL_TABLET | Freq: Four times a day (QID) | ORAL | 0 refills | Status: AC | PRN

## 2024-07-13 MED ORDER — CEPHALEXIN 500 MG PO CAPS: 500.0000 mg | ORAL_CAPSULE | Freq: Three times a day (TID) | ORAL | 0 refills | Status: AC

## 2024-07-13 NOTE — Progress Notes (Signed)
° °  Assessment: 1. Encounter for vasectomy     Plan: Post vasectomy instructions given Rx sent. Post vasectomy semen analysis in 12 weeks  Chief Complaint:  Chief Complaint  Patient presents with   VAS    History of Present Illness:  Eduardo Powell is a 43 y.o. male who is seen for vasectomy. He is married with 2 children.  No history of scrotal trauma or infection.  Past Medical History:  Past Medical History:  Diagnosis Date   Hypertension     Past Surgical History:  Past Surgical History:  Procedure Laterality Date   CHOLECYSTECTOMY     rt side acl surgery      Allergies:  No Known Allergies  Family History:  Family History  Problem Relation Age of Onset   Cancer Father    Cancer Other     Social History:  Social History   Tobacco Use   Smoking status: Never   Smokeless tobacco: Never  Vaping Use   Vaping status: Never Used  Substance Use Topics   Alcohol use: Yes    Alcohol/week: 12.0 standard drinks of alcohol    Types: 12 Cans of beer per week    Comment: ususally spread out over a week.   Drug use: Never    ROS: Constitutional:  Negative for fever, chills, weight loss CV: Negative for chest pain, previous MI, hypertension Respiratory:  Negative for shortness of breath, wheezing, sleep apnea, frequent cough GI:  Negative for nausea, vomiting, bloody stool, GERD  Physical exam: BP 111/72   Pulse 73   Ht 5' 11 (1.803 m)   Wt 279 lb (126.6 kg)   BMI 38.91 kg/m  GENERAL APPEARANCE:  Well appearing, well developed, well nourished, NAD  Results: None  VASECTOMY PROCEDURE:  Eduardo Powell presents for vasectomy following previous vasectomy consultation and permit is signed.  The patient's anterior scrotal wall is shaved and prepped with Betadine in standard sterile fashion.  1% lidocaine is used as local anesthetic in the scrotal and peri vasal tissue.  A standard median raphe punch incision is made and a no scalpel technique vasectomy  is performed.  Bilateral vas are isolated from the peri vasal tissue and an approximately 1 cm segment of vas is excised.  Proximal and distal segments are internally cauterized with electric heat cautery. Interposition of perivasal tissue was performed.  Bilateral palpation confirms bilateral vasectomy defect and no significant bleeding or hematoma is identified.  Neosporin gauze dressing and a scrotal support are applied.    Disposition: Patient is discharged home with Rx for pain medication and antibiotics.  Patient is given routine vasectomy instructions.   Most importantly, he is instructed and cautioned again regarding the need for protected intercourse until such time that a single  negative semen analysis has been obtained.  The initial semen analysis will be checked in approximately 12  weeks.  The patient reports a clear understanding.  He will call with any interval questions or concerns.

## 2024-07-13 NOTE — Patient Instructions (Signed)

## 2024-08-08 ENCOUNTER — Encounter: Payer: Self-pay | Admitting: Medical

## 2024-08-08 ENCOUNTER — Other Ambulatory Visit: Payer: Self-pay

## 2024-08-08 MED ORDER — ZEPBOUND 2.5 MG/0.5ML ~~LOC~~ SOAJ: 2.5000 mg | SUBCUTANEOUS | 0 refills | Status: DC

## 2024-09-09 ENCOUNTER — Encounter: Payer: Self-pay | Admitting: Medical

## 2024-09-09 ENCOUNTER — Other Ambulatory Visit: Payer: Self-pay

## 2024-09-09 MED ORDER — ZEPBOUND 2.5 MG/0.5ML ~~LOC~~ SOAJ: 2.5000 mg | SUBCUTANEOUS | 0 refills | Status: AC

## 2024-10-11 ENCOUNTER — Other Ambulatory Visit: Payer: PRIVATE HEALTH INSURANCE
